# Patient Record
Sex: Male | Born: 1955 | Race: White | Hispanic: No | Smoking: Former smoker
Health system: Southern US, Community
[De-identification: ages and names within clinical notes are randomized; demographics above are authoritative.]

## PROBLEM LIST (undated history)

## (undated) DIAGNOSIS — I6521 Occlusion and stenosis of right carotid artery: Secondary | ICD-10-CM

## (undated) DIAGNOSIS — I509 Heart failure, unspecified: Secondary | ICD-10-CM

## (undated) DIAGNOSIS — I5020 Unspecified systolic (congestive) heart failure: Secondary | ICD-10-CM

## (undated) DIAGNOSIS — F1021 Alcohol dependence, in remission: Secondary | ICD-10-CM

## (undated) DIAGNOSIS — Z95810 Presence of automatic (implantable) cardiac defibrillator: Secondary | ICD-10-CM

## (undated) DIAGNOSIS — E7800 Pure hypercholesterolemia, unspecified: Secondary | ICD-10-CM

## (undated) DIAGNOSIS — R918 Other nonspecific abnormal finding of lung field: Secondary | ICD-10-CM

## (undated) DIAGNOSIS — I639 Cerebral infarction, unspecified: Secondary | ICD-10-CM

## (undated) DIAGNOSIS — I10 Essential (primary) hypertension: Secondary | ICD-10-CM

## (undated) HISTORY — PX: APPENDECTOMY: SHX54

## (undated) HISTORY — DX: Essential (primary) hypertension: I10

## (undated) HISTORY — PX: CATARACT EXTRACTION: SUR2

## (undated) HISTORY — DX: Pure hypercholesterolemia, unspecified: E78.00

## (undated) HISTORY — PX: TONSILLECTOMY: SUR1361

---
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
## 2019-05-01 DIAGNOSIS — R42 Dizziness and giddiness: Secondary | ICD-10-CM | POA: Diagnosis not present

## 2019-05-01 DIAGNOSIS — I472 Ventricular tachycardia: Secondary | ICD-10-CM | POA: Diagnosis not present

## 2019-05-01 DIAGNOSIS — I517 Cardiomegaly: Secondary | ICD-10-CM | POA: Diagnosis not present

## 2019-05-01 DIAGNOSIS — I110 Hypertensive heart disease with heart failure: Secondary | ICD-10-CM

## 2019-05-01 DIAGNOSIS — F1010 Alcohol abuse, uncomplicated: Secondary | ICD-10-CM

## 2019-05-01 DIAGNOSIS — I426 Alcoholic cardiomyopathy: Secondary | ICD-10-CM

## 2019-05-01 DIAGNOSIS — F17210 Nicotine dependence, cigarettes, uncomplicated: Secondary | ICD-10-CM

## 2019-05-01 DIAGNOSIS — I6389 Other cerebral infarction: Secondary | ICD-10-CM | POA: Diagnosis not present

## 2019-05-01 DIAGNOSIS — R739 Hyperglycemia, unspecified: Secondary | ICD-10-CM

## 2019-05-01 DIAGNOSIS — G4700 Insomnia, unspecified: Secondary | ICD-10-CM

## 2019-05-01 DIAGNOSIS — I5021 Acute systolic (congestive) heart failure: Secondary | ICD-10-CM | POA: Diagnosis not present

## 2019-05-02 DIAGNOSIS — I5021 Acute systolic (congestive) heart failure: Secondary | ICD-10-CM | POA: Diagnosis not present

## 2019-05-02 DIAGNOSIS — I6389 Other cerebral infarction: Secondary | ICD-10-CM | POA: Diagnosis not present

## 2019-05-02 DIAGNOSIS — R42 Dizziness and giddiness: Secondary | ICD-10-CM | POA: Diagnosis not present

## 2019-05-02 DIAGNOSIS — I472 Ventricular tachycardia: Secondary | ICD-10-CM | POA: Diagnosis not present

## 2019-05-03 DIAGNOSIS — I472 Ventricular tachycardia: Secondary | ICD-10-CM | POA: Diagnosis not present

## 2019-05-03 DIAGNOSIS — I517 Cardiomegaly: Secondary | ICD-10-CM | POA: Diagnosis not present

## 2019-05-03 DIAGNOSIS — R42 Dizziness and giddiness: Secondary | ICD-10-CM | POA: Diagnosis not present

## 2019-05-03 DIAGNOSIS — I5021 Acute systolic (congestive) heart failure: Secondary | ICD-10-CM | POA: Diagnosis not present

## 2019-05-03 DIAGNOSIS — I6389 Other cerebral infarction: Secondary | ICD-10-CM | POA: Diagnosis not present

## 2019-05-04 DIAGNOSIS — I5021 Acute systolic (congestive) heart failure: Secondary | ICD-10-CM | POA: Diagnosis not present

## 2019-05-04 DIAGNOSIS — R42 Dizziness and giddiness: Secondary | ICD-10-CM | POA: Diagnosis not present

## 2019-05-04 DIAGNOSIS — I6389 Other cerebral infarction: Secondary | ICD-10-CM | POA: Diagnosis not present

## 2019-05-04 DIAGNOSIS — I472 Ventricular tachycardia: Secondary | ICD-10-CM | POA: Diagnosis not present

## 2019-05-09 DIAGNOSIS — I5020 Unspecified systolic (congestive) heart failure: Secondary | ICD-10-CM

## 2019-05-09 DIAGNOSIS — I639 Cerebral infarction, unspecified: Secondary | ICD-10-CM

## 2019-05-09 DIAGNOSIS — Z8673 Personal history of transient ischemic attack (TIA), and cerebral infarction without residual deficits: Secondary | ICD-10-CM

## 2019-05-09 HISTORY — DX: Unspecified systolic (congestive) heart failure: I50.20

## 2019-05-09 HISTORY — DX: Personal history of transient ischemic attack (TIA), and cerebral infarction without residual deficits: Z86.73

## 2019-05-09 HISTORY — DX: Cerebral infarction, unspecified: I63.9

## 2019-05-15 DIAGNOSIS — I6521 Occlusion and stenosis of right carotid artery: Secondary | ICD-10-CM

## 2019-05-15 DIAGNOSIS — R918 Other nonspecific abnormal finding of lung field: Secondary | ICD-10-CM

## 2019-05-15 DIAGNOSIS — I6523 Occlusion and stenosis of bilateral carotid arteries: Secondary | ICD-10-CM | POA: Insufficient documentation

## 2019-05-15 HISTORY — DX: Occlusion and stenosis of right carotid artery: I65.21

## 2019-05-15 HISTORY — DX: Other nonspecific abnormal finding of lung field: R91.8

## 2019-05-15 HISTORY — DX: Occlusion and stenosis of bilateral carotid arteries: I65.23

## 2019-05-16 ENCOUNTER — Encounter: Payer: Self-pay | Admitting: *Deleted

## 2019-05-16 DIAGNOSIS — I509 Heart failure, unspecified: Secondary | ICD-10-CM | POA: Insufficient documentation

## 2019-05-16 DIAGNOSIS — E7800 Pure hypercholesterolemia, unspecified: Secondary | ICD-10-CM | POA: Insufficient documentation

## 2019-05-16 DIAGNOSIS — I10 Essential (primary) hypertension: Secondary | ICD-10-CM | POA: Insufficient documentation

## 2019-05-16 DIAGNOSIS — F1021 Alcohol dependence, in remission: Secondary | ICD-10-CM

## 2019-05-16 HISTORY — DX: Alcohol dependence, in remission: F10.21

## 2019-05-16 HISTORY — DX: Heart failure, unspecified: I50.9

## 2019-05-20 NOTE — Progress Notes (Signed)
Cardiology Office Note:    Date:  05/21/2019   ID:  Kenneth Pham, DOB 11-Jan-1956, MRN [id number]  PCP:  Olive Bass, MD  Cardiologist:  Norman Herrlich, MD   Referring MD: No ref. provider found  ASSESSMENT:    1. Dilated cardiomyopathy (HCC)   2. Hypertensive heart disease with heart failure (HCC)   3. Hypercholesteremia   4. Carotid stenosis, bilateral    PLAN:    In order of problems listed above:  1. He has a severe dilated cardiomyopathy he resume a diuretic he is in decompensated heart failure switch from ACE inhibitor to [place] start low-dose MRA and with the severity of his decompensation see him back and we can arrange for the home heart failure program with the [facility] to participate in his care.  Ultimately he may require coronary angiography left and right heart catheterization.  Depending on his response he may require an ICD.  I have asked him to start with Lasix like sodium restriction and daily weights. 2. Hypertension poorly controlled initiate a diuretic switch to Entresto start MRA heart failure decompensated start his diuretic 3. Continue statin 4. Mild bilateral stenosis will require follow-up duplex in a year  Next appointment 1 week   Medication Adjustments/Labs and Tests Ordered: Current medicines are reviewed at length with the patient today.  Concerns regarding medicines are outlined above.  No orders of the defined types were placed in this encounter.  No orders of the defined types were placed in this encounter.    Chief Complaint  Patient presents with  . Cardiomyopathy    EF < 20%    History of Present Illness:    Kenneth Pham is a 63 y.o. male with a history of hypertension and hyper lipidemia who is being seen today for the evaluation of heart failure at the request of No ref. provider found.  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  MRA and admission showed right vertebral artery stenosis and less than 50% right proximal ICA  stenosis. I reviewed the echocardiogram from [facility] performed during the admission EF estimated 20% associated with a low level static troponin elevation.  He was seen in consultation by a cardiologist 05/02/2019 there is low-level troponin elevation ranging 0.10-0.17 BNP level is elevated at 6910 EKG showed left ventricular hypertrophy and repolarization and echocardiogram is described as global hypokinesia ejection fraction 20%.  CT scan showed stroke in the right cerebellum.  His past history was noteworthy for hypertension and congestive heart failure he was on no home medications when he presented to the hospital MRI of the head and neck showed acute infarct in the right cerebellum moderate narrowing of the right vertebral artery mild narrowing of both proximal internal carotid arteries less than 50%.  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  Medical  therapy was initiated with diuretic beta-blocker ACE inhibitor..  He had no ventricular arrhythmia noted while in hospital.  On presentation to the hospital his initial blood pressure was 182/101.  In retrospect he has had significant shortness of breath for over 3 months he short of breath when he walks outdoors climbs stairs and works in [employer] and truly struggles to unload a truck.  He also has had orthopnea paroxysmal nocturnal dyspnea was unaware he had significant edema until I showed him on physical exam unfortunately the hospital he was told to take diuretic as needed me did not  feel he needed at times add salt to his food.  He has no history of angina coronary artery disease congenital rheumatic heart disease.  His alcohol intake is been constant but he drinks 225 ounce hard cider is a day has no history of alcohol withdrawal syndrome alcohol related illness and I doubt his cardiomyopathy is alcoholic.  Fortunately since his  stroke he does not smoke or drink.  He has had no syncope.  Presently he is quite short of breath and is having trouble having to sit up at night short of breath is physical examination shows decompensated heart failure  Past Medical History:  Diagnosis Date  . Acute CHF (HCC) 05/16/2019  . Alcohol dependence in remission (HCC) 05/16/2019  . Arteriosclerosis of carotid artery, right 05/15/2019  . Cerebellar stroke, acute (HCC) 05/09/2019  . Heart failure with reduced ejection fraction (HCC) 05/09/2019  . Hypercholesteremia   . Hypertension   . Pulmonary nodules 05/15/2019   05/01/2019 CT angio - 8.267mm bandlike nodular density lingula - 5.703mm R middle lobe - 61.5 pack year hx, rec 3 month f/u    Past Surgical History:  Procedure Laterality Date  . APPENDECTOMY    . CATARACT EXTRACTION Right   . TONSILLECTOMY      Current Medications: Current Meds  Medication Sig  . aspirin EC 81 MG tablet Take by mouth.  Marland Kitchen. atorvastatin (LIPITOR) 40 MG tablet Take by mouth daily.  . furosemide (LASIX) 20 MG tablet Take 20 mg by mouth daily as needed.   Marland Kitchen. lisinopril (ZESTRIL) 5 MG tablet Take 5 mg by mouth daily.   . metoprolol succinate (TOPROL-XL) 25 MG 24 hr tablet Take 25 mg by mouth daily.   Marland Kitchen. thiamine (VITAMIN B-1) 100 MG tablet Take 100 mg by mouth daily.      Allergies:   Patient has no known allergies.   Social History   Socioeconomic History  . Marital status: Legally Separated    Spouse name: Not on file  . Number of children: Not on file  . Years of education: Not on file  . Highest education level: Not on file  Occupational History  . Not on file  Social Needs  . Financial resource strain: Not on file  . Food insecurity    Worry: Not on file    Inability: Not on file  . Transportation needs    Medical: Not on file    Non-medical: Not on file  Tobacco Use  . Smoking status: Former Smoker    Types: Cigarettes    Quit date: 04/29/2019    Years since quitting: 0.0  . Smokeless  tobacco: Never Used  Substance and Sexual Activity  . Alcohol use: Not Currently    Alcohol/week: 14.0 standard drinks    Types: 14 Cans of beer per week  . Drug use: Not Currently    Types: Marijuana  . Sexual activity: Not on file  Lifestyle  . Physical activity    Days per week: Not on file    Minutes per session: Not on file  . Stress: Not on file  Relationships  . Social Musicianconnections    Talks on phone: Not on file    Gets together: Not on file    Attends religious service: Not on file    Active member of club or organization: Not on file    Attends meetings of clubs or organizations: Not on file    Relationship status: Not on file  Other Topics Concern  .  Not on file  Social History Narrative  . Not on file     Family History: The patient's family history includes Cancer in his father; Heart Problems in his brother; Stroke in his mother.  ROS:   Review of Systems  Constitution: Positive for malaise/fatigue.  HENT: Negative.   Eyes: Negative.   Cardiovascular: Positive for leg swelling, orthopnea and paroxysmal nocturnal dyspnea.  Respiratory: Positive for shortness of breath.   Endocrine: Negative.   Hematologic/Lymphatic: Negative.   Skin: Negative.   Musculoskeletal: Negative.   Gastrointestinal: Negative.   Genitourinary: Negative.   Neurological: Positive for dizziness.  Psychiatric/Behavioral: Negative.   Allergic/Immunologic: Negative.    Please see the history of present illness.     All other systems reviewed and are negative.  EKGs/Labs/Other Studies Reviewed:    The following studies were reviewed today:   EKG:  EKG 05/01/2019 is personally reviewed and demonstrates sinus rhythm LVH repolarization   Physical Exam:    VS:  Pulse 91   Ht 5\' 10"  (1.778 m)   Wt 179 lb 9.6 oz (81.5 kg)   SpO2 95%   BMI 25.77 kg/m     Wt Readings from Last 3 Encounters:  05/21/19 179 lb 9.6 oz (81.5 kg)  05/09/19 180 lb (81.6 kg)     GEN:  Well nourished,  well developed in no acute distress HEENT: Normal NECK: Moderate JVD; No carotid bruits LYMPHATICS: No lymphadenopathy CARDIAC: RRR S1 is diminished S3 gallop at the apex no murmur RESPIRATORY: Diffusely diminished breath sounds no rales  aBDOMEN: Soft, non-tender, non-distended MUSCULOSKELETAL: He has greater than 4+ bilateral ankle to knee pitting edema; No deformity  SKIN: Warm and dry NEUROLOGIC:  Alert and oriented x 3 PSYCHIATRIC:  Normal affect     Signed, Shirlee More, MD  05/21/2019 10:51 AM    [place]

## 2019-05-21 ENCOUNTER — Ambulatory Visit (INDEPENDENT_AMBULATORY_CARE_PROVIDER_SITE_OTHER): Payer: BC Managed Care – PPO | Admitting: Cardiology

## 2019-05-21 ENCOUNTER — Encounter: Payer: Self-pay | Admitting: Cardiology

## 2019-05-21 ENCOUNTER — Other Ambulatory Visit: Payer: Self-pay

## 2019-05-21 ENCOUNTER — Encounter: Payer: Self-pay | Admitting: *Deleted

## 2019-05-21 VITALS — BP 148/92 | HR 91 | Ht 70.0 in | Wt 179.6 lb

## 2019-05-21 DIAGNOSIS — I6523 Occlusion and stenosis of bilateral carotid arteries: Secondary | ICD-10-CM

## 2019-05-21 DIAGNOSIS — I110 Hypertensive heart disease with heart failure: Secondary | ICD-10-CM

## 2019-05-21 DIAGNOSIS — E7800 Pure hypercholesterolemia, unspecified: Secondary | ICD-10-CM

## 2019-05-21 DIAGNOSIS — I420 Dilated cardiomyopathy: Secondary | ICD-10-CM

## 2019-05-21 LAB — BASIC METABOLIC PANEL
BUN/Creatinine Ratio: 12 (ref 10–24)
BUN: 15 mg/dL (ref 8–27)
CO2: 21 mmol/L (ref 20–29)
Calcium: 9.6 mg/dL (ref 8.6–10.2)
Chloride: 104 mmol/L (ref 96–106)
Creatinine, Ser: 1.25 mg/dL (ref 0.76–1.27)
GFR calc Af Amer: 71 mL/min/{1.73_m2} (ref >59)
GFR calc non Af Amer: 61 mL/min/{1.73_m2} (ref >59)
Glucose: 102 mg/dL — ABNORMAL HIGH (ref 65–99)
Potassium: 5.7 mmol/L — ABNORMAL HIGH (ref 3.5–5.2)
Sodium: 138 mmol/L (ref 134–144)

## 2019-05-21 LAB — PRO B NATRIURETIC PEPTIDE: NT-Pro BNP: 9196 pg/mL — ABNORMAL HIGH (ref 0–210)

## 2019-05-21 MED ORDER — SPIRONOLACTONE 25 MG PO TABS: 12.5000 mg | ORAL_TABLET | Freq: Every day | ORAL | 1 refills | Status: DC

## 2019-05-21 MED ORDER — SACUBITRIL-VALSARTAN 49-51 MG PO TABS: 1.0000 | ORAL_TABLET | Freq: Two times a day (BID) | ORAL | 3 refills | Status: DC

## 2019-05-21 MED ORDER — FUROSEMIDE 20 MG PO TABS: 20.0000 mg | ORAL_TABLET | Freq: Two times a day (BID) | ORAL | 1 refills | Status: DC

## 2019-05-21 NOTE — Patient Instructions (Signed)
Medication Instructions:  Your physician has recommended you make the following change in your medication:   STOP lisinopril  START sacubitril-valsartan (entresto) 49-51 mg: Take 1 tablet twice daily.Marland KitchenMarland KitchenAFTER STOPPING LISINOPRIL FOR 3 DAYS!   START spironolactone (aldactone) 25 mg: Take 0.5 tablet (12.5 mg) daily  INCREASE furosemide (lasix) 20 mg: Take 1 tablet twice daily  If you need a refill on your cardiac medications before your next appointment, please call your pharmacy.   Lab work: Your physician recommends that you return for lab work today: BMP, ProBNP.   If you have labs (blood work) drawn today and your tests are completely normal, you will receive your results only by: Marland Kitchen MyChart Message (if you have MyChart) OR . A paper copy in the mail If you have any lab test that is abnormal or we need to change your treatment, we will call you to review the results.  Testing/Procedures: None  Follow-Up: At [facility], you and your health needs are our priority.  As part of our continuing mission to provide you with exceptional heart care, we have created designated Provider Care Teams.  These Care Teams include your primary Cardiologist (physician) and Advanced Practice Providers (APPs -  Physician Assistants and Nurse Practitioners) who all work together to provide you with the care you need, when you need it. You will need a follow up appointment in 1 weeks.     Sacubitril; Valsartan oral tablet What is this medicine? SACUBITRIL; VALSARTAN (sak UE bi tril; val SAR tan) is a combination of 2 drugs used to reduce the risk of death and hospitalizations in people with long-lasting heart failure. It is usually used with other medicines to treat heart failure. This medicine may be used for other purposes; ask your health care provider or pharmacist if you have questions. COMMON BRAND NAME(S): Entresto What should I tell my health care provider before I take this medicine? They  need to know if you have any of these conditions:  diabetes and take a medicine that contains aliskiren  kidney disease  liver disease  an unusual or allergic reaction to sacubitril; valsartan, drugs called angiotensin converting enzyme (ACE) inhibitors, angiotensin II receptor blockers (ARBs), other medicines, foods, dyes, or preservatives  pregnant or trying to get pregnant  breast-feeding How should I use this medicine? Take this medicine by mouth with a glass of water. Follow the directions on the prescription label. You can take it with or without food. If it upsets your stomach, take it with food. Take your medicine at regular intervals. Do not take it more often than directed. Do not stop taking except on your doctor's advice. Do not take this medicine for at least 36 hours before or after you take an ACE inhibitor medicine. Talk to your health care provider if you are not sure if you take an ACE inhibitor. Talk to your pediatrician regarding the use of this medicine in children. Special care may be needed. Overdosage: If you think you have taken too much of this medicine contact a poison control center or emergency room at once. NOTE: This medicine is only for you. Do not share this medicine with others. What if I miss a dose? If you miss a dose, take it as soon as you can. If it is almost time for next dose, take only that dose. Do not take double or extra doses. What may interact with this medicine? Do not take this medicine with any of the following medicines:  aliskiren if  you have diabetes  angiotensin-converting enzyme (ACE) inhibitors, like benazepril, captopril, enalapril, fosinopril, lisinopril, or ramipril This medicine may also interact with the following medicines:  angiotensin II receptor blockers (ARBs) like azilsartan, candesartan, eprosartan, irbesartan, losartan, olmesartan, telmisartan, or valsartan  lithium  NSAIDS, medicines for pain and inflammation, like  ibuprofen or naproxen  potassium-sparing diuretics like amiloride, spironolactone, and triamterene  potassium supplements This list may not describe all possible interactions. Give your health care provider a list of all the medicines, herbs, non-prescription drugs, or dietary supplements you use. Also tell them if you smoke, drink alcohol, or use illegal drugs. Some items may interact with your medicine. What should I watch for while using this medicine? Tell your doctor or healthcare professional if your symptoms do not start to get better or if they get worse. Do not become pregnant while taking this medicine. Women should inform their doctor if they wish to become pregnant or think they might be pregnant. There is a potential for serious side effects to an unborn child. Talk to your health care professional or pharmacist for more information. You may get dizzy. Do not drive, use machinery, or do anything that needs mental alertness until you know how this medicine affects you. Do not stand or sit up quickly, especially if you are an older patient. This reduces the risk of dizzy or fainting spells. Avoid alcoholic drinks; they can make you more dizzy. What side effects may I notice from receiving this medicine? Side effects that you should report to your doctor or health care professional as soon as possible:  allergic reactions like skin rash, itching or hives, swelling of the face, lips, or tongue  signs and symptoms of increased potassium like muscle weakness; chest pain; or fast, irregular heartbeat  signs and symptoms of kidney injury like trouble passing urine or change in the amount of urine  signs and symptoms of low blood pressure like feeling dizzy or lightheaded, or if you develop extreme fatigue Side effects that usually do not require medical attention (report to your doctor or health care professional if they continue or are bothersome):  cough This list may not describe all  possible side effects. Call your doctor for medical advice about side effects. You may report side effects to FDA at 1-800-FDA-1088. Where should I keep my medicine? Keep out of the reach of children. Store at room temperature between 15 and 30 degrees C (59 and 86 degrees F). Throw away any unused medicine after the expiration date. NOTE: This sheet is a summary. It may not cover all possible information. If you have questions about this medicine, talk to your doctor, pharmacist, or health care provider.  2020 Elsevier/Gold Standard (2015-12-17 13:54:19)   Spironolactone tablets What is this medicine? SPIRONOLACTONE ([place] on [place] LAK tone) is a diuretic. It helps you make more urine and to lose excess water from your body. This medicine is used to treat high blood pressure, and edema or swelling from heart, kidney, or liver disease. It is also used to treat patients who make too much aldosterone or have low potassium. This medicine may be used for other purposes; ask your health care provider or pharmacist if you have questions. COMMON BRAND NAME(S): Aldactone What should I tell my health care provider before I take this medicine? They need to know if you have any of these conditions:  high blood level of potassium  kidney disease or trouble making urine  liver disease  an unusual or allergic  reaction to spironolactone, other medicines, foods, dyes, or preservatives  pregnant or trying to get pregnant  breast-feeding How should I use this medicine? Take this medicine by mouth with a drink of water. Follow the directions on your prescription label. You can take it with or without food. If it upsets your stomach, take it with food. Do not take your medicine more often than directed. Remember that you will need to pass more urine after taking this medicine. Do not take your doses at a time of day that will cause you problems. Do not take at bedtime. Talk to your pediatrician regarding the  use of this medicine in children. While this drug may be prescribed for selected conditions, precautions do apply. Overdosage: If you think you have taken too much of this medicine contact a poison control center or emergency room at once. NOTE: This medicine is only for you. Do not share this medicine with others. What if I miss a dose? If you miss a dose, take it as soon as you can. If it is almost time for your next dose, take only that dose. Do not take double or extra doses. What may interact with this medicine? Do not take this medicine with any of the following medications:  cidofovir  eplerenone  tranylcypromine This medicine may also interact with the following medications:  aspirin  certain medicines for blood pressure or heart disease like benazepril, lisinopril, losartan, valsartan  certain medicines that treat or prevent blood clots like heparin and enoxaparin  cholestyramine  cyclosporine  digoxin  lithium  medicines that relax muscles for surgery  NSAIDs, medicines for pain and inflammation, like ibuprofen or naproxen  other diuretics  potassium supplements  steroid medicines like prednisone or cortisone  trimethoprim This list may not describe all possible interactions. Give your health care provider a list of all the medicines, herbs, non-prescription drugs, or dietary supplements you use. Also tell them if you smoke, drink alcohol, or use illegal drugs. Some items may interact with your medicine. What should I watch for while using this medicine? Visit your doctor or health care professional for regular checks on your progress. Check your blood pressure as directed. Ask your doctor what your blood pressure should be, and when you should contact them. You may need to be on a special diet while taking this medicine. Ask your doctor. Also, ask how many glasses of fluid you need to drink a day. You must not get dehydrated. This medicine may make you feel  confused, dizzy or lightheaded. Drinking alcohol and taking some medicines can make this worse. Do not drive, use machinery, or do anything that needs mental alertness until you know how this medicine affects you. Do not sit or stand up quickly. What side effects may I notice from receiving this medicine? Side effects that you should report to your doctor or health care professional as soon as possible:  allergic reactions such as skin rash or itching, hives, swelling of the lips, mouth, tongue, or throat  black or tarry stools  fast, irregular heartbeat  fever  muscle pain, cramps  numbness, tingling in hands or feet  trouble breathing  trouble passing urine  unusual bleeding  unusually weak or tired Side effects that usually do not require medical attention (report to your doctor or health care professional if they continue or are bothersome):  change in voice or hair growth  confusion  dizzy, drowsy  dry mouth, increased thirst  enlarged or tender breasts  headache  irregular menstrual periods  sexual difficulty, unable to have an erection  stomach upset This list may not describe all possible side effects. Call your doctor for medical advice about side effects. You may report side effects to FDA at 1-800-FDA-1088. Where should I keep my medicine? Keep out of the reach of children. Store below 25 degrees C (77 degrees F). Throw away any unused medicine after the expiration date. NOTE: This sheet is a summary. It may not cover all possible information. If you have questions about this medicine, talk to your doctor, pharmacist, or health care provider.  2020 Elsevier/Gold Standard (2017-03-18 09:42:28)

## 2019-05-27 NOTE — Progress Notes (Signed)
Cardiology Office Note:    Date:  05/29/2019   ID:  [name], DOB [birth year], MRN [id number]  PCP:  Olive Bass, MD  Cardiologist:  Norman Herrlich, MD    Referring MD: Olive Bass, MD    ASSESSMENT:    1. Hypertensive heart disease with heart failure (HCC)   2. Chronic systolic heart failure (HCC)   3. Dilated cardiomyopathy (HCC)   4. Carotid stenosis, bilateral   5. CKD (chronic kidney disease) stage 2, GFR 60-89 ml/min   6. Snores    PLAN:    In order of problems listed above:  1. Hypertensive heart disease with heart failure -   HTN: Concern for orthostatic hypotension. BP on standing 104/80. Will change Entresto 49/51mg  to daily for 2 weeks then increase to twice daily. Continue to check BP daily at home. Encouraged him to be slow to stand, use of compression stockings. NYHA class II.   HF: Much improved from last visit. Continue dyspnea on exertion, PND. Short of breath even speaking to me during exam. Obtain ProBNP today. Otherwise appears euvolemic - no edema. Has been on Entresto 4 days, hope for improvement with continued dosing. Concern that this SOB is of other origin - consider anemia versus lung disease. Obtain CBC today.   GDMT: Loop diuretic, low dose MRA, Entresto. BMP today.  2. Chronic systolic heart failure - EF 20% at [facility] 04/2019. Plan as above, see #1. Consider repeat echocardiogram 3 months after initiation of Entresto for consideration of ICD.  3. Dilated cardiomyopathy - Plan as above, see #1 and #2. 4. CKD2 - BMP today. Careful titration of diuretics and Entresto.  5. HLD - Stable. Continue atorvastatin. Consider recheck of lipid profile.  6. Carotid stenosis bilateral - Mild bilateral stenosis. Follow up duplex in one year, approximately June 2021.  7. CVA - Follows with neurology. Modification of risk factors including HTN, carotid stenosis, HLD.  8. Snores - Consider outpatient sleep study evaluation.    Next appointment: 4  weeks   Medication Adjustments/Labs and Tests Ordered: Current medicines are reviewed at length with the patient today.  Concerns regarding medicines are outlined above.  Orders Placed This Encounter  Procedures  . CBC  . Pro b natriuretic peptide  . Basic Metabolic Panel (BMET)   Meds ordered this encounter  Medications  . sacubitril-valsartan (ENTRESTO) 49-51 MG    Sig: Take 1 tab daily x 2 weeks then increase to 1 tab twice daily    Dispense:  60 tablet    Refill:  1    Please Honor Card patient is presenting for Cecelia Byars: [id number]; Alvira Philips: [id number]; RXCPN: [id number]; ISSUER: [id number] ID: [id number]  . furosemide (LASIX) 20 MG tablet    Sig: Take 1 tablet (20 mg total) by mouth 2 (two) times daily.    Dispense:  60 tablet    Refill:  1  . spironolactone (ALDACTONE) 25 MG tablet    Sig: Take 0.5 tablets (12.5 mg total) by mouth daily.    Dispense:  15 tablet    Refill:  2    Chief Complaint: 63 yo male presents for follow up of heart failure and cardiomyopathy after initiation of Entresto and adjustment of diuretics.   History of Present Illness:    Sylvesta Kottman is a 63 y.o. male with a hx of hypertension and hyperlipidemia.  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  MRA and admission showed right vertebral artery stenosis and less than 50% right proximal ICA  stenosis.  I reviewed the echocardi[facility] performed during the admission EF estimated 20% associated with a low level static troponin elevation.  He was seen in consultation by a cardiologist 05/02/2019 there is low-level troponin elevation ranging 0.10-0.17 BNP level is elevated at 6910 EKG showed left ventricular hypertrophy and repolarization and echocardiogram is described as global hypokinesia ejection fraction 20%.  CT scan showed stroke in the right cerebellum.  His past history was noteworthy for hypertension and congestive heart failure he was on no home medications when he presented to the  hospital MRI of the head and neck showed acute infarct in the right cerebellum moderate narrowing of the right vertebral artery mild narrowing of both proximal internal carotid arteries less than 50%.  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  Medical  therapy was initiated with diuretic beta-blocker ACE inhibitor..  He had no ventricular arrhythmia noted while in hospital.  On presentation to the hospital his initial blood pressure was 182/101.  He was last seen by me in the office 05/21/2019 with decompensated heart failure.  With his severe cardiomyopathy I asked him to resume a loop diuretic and transition from ACE inhibitor to [place].  We initiated discussion about the potential for an ICD and made arrangements for office follow-up in 1 week.  He also has mild bilateral carotid stenosis will require a follow-up cerebrovascular duplex in 1 year.  He reports feeling some better than when last seen. Has been on Entresto for 4 days and notices some improvement in his breathing and energy. Does still report that he is short of breath on exertion and when laying down. He is able to sleep in the bed with one pillow, but has episodes of PND every night.   Reports no lower extremity edema. Tells me he weighs himself daily and consistently gets 170lbs. Checks his weight daily, encouraged to continue.   Does endorse new onset dizziness over the last 2-3 weeks. Reports feeling "lightheaded" when he stands up. Standing BP checked - 104/80. Associated with feelings of loss of balance. No syncope. We discussed taking it slow when standing and compression socks.  He works at [employer] and has been out of work for one month. Brought paperwork for FMLA and will have him go through the process to submit this for approval. He is considering retiring.   He has an appointment with neurology tomorrow for stroke  follow up. Encouraged him to keep the appointment.   Compliance with diet, lifestyle and medications: Yes Past Medical History:  Diagnosis Date  . Acute CHF (HCC) 05/16/2019  . Alcohol dependence in remission (HCC) 05/16/2019  . Arteriosclerosis of carotid artery, right 05/15/2019  . Cerebellar stroke, acute (HCC) 05/09/2019  . Heart failure with reduced ejection fraction (HCC) 05/09/2019  . Hypercholesteremia   . Hypertension   . Pulmonary nodules 05/15/2019   05/01/2019 CT angio - 8.167mm bandlike nodular density lingula - 5.363mm R middle lobe - 61.5 pack year hx, rec 3 month f/u    Past Surgical History:  Procedure Laterality Date  . APPENDECTOMY    . CATARACT EXTRACTION Right   . TONSILLECTOMY      Current Medications: Current Meds  Medication Sig  . aspirin EC 81 MG tablet Take 81 mg by mouth daily.   Marland Kitchen. atorvastatin (LIPITOR) 40 MG tablet Take 40 mg by mouth daily.   . furosemide (LASIX) 20 MG tablet  Take 1 tablet (20 mg total) by mouth 2 (two) times daily.  . metoprolol succinate (TOPROL-XL) 25 MG 24 hr tablet Take 25 mg by mouth daily.   . sacubitril-valsartan (ENTRESTO) 49-51 MG Take 1 tab daily x 2 weeks then increase to 1 tab twice daily  . spironolactone (ALDACTONE) 25 MG tablet Take 0.5 tablets (12.5 mg total) by mouth daily.  Marland Kitchen. thiamine (VITAMIN B-1) 100 MG tablet Take 100 mg by mouth daily.   . [DISCONTINUED] furosemide (LASIX) 20 MG tablet Take 1 tablet (20 mg total) by mouth 2 (two) times daily.  . [DISCONTINUED] sacubitril-valsartan (ENTRESTO) 49-51 MG Take 1 tablet by mouth 2 (two) times daily.  . [DISCONTINUED] spironolactone (ALDACTONE) 25 MG tablet Take 0.5 tablets (12.5 mg total) by mouth daily.     Allergies:   Patient has no known allergies.   Social History   Socioeconomic History  . Marital status: Legally Separated    Spouse name: Not on file  . Number of children: Not on file  . Years of education: Not on file  . Highest education level: Not on file   Occupational History  . Not on file  Social Needs  . Financial resource strain: Not on file  . Food insecurity    Worry: Not on file    Inability: Not on file  . Transportation needs    Medical: Not on file    Non-medical: Not on file  Tobacco Use  . Smoking status: Former Smoker    Types: Cigarettes    Quit date: 04/29/2019    Years since quitting: 0.0  . Smokeless tobacco: Never Used  Substance and Sexual Activity  . Alcohol use: Not Currently    Alcohol/week: 14.0 standard drinks    Types: 14 Cans of beer per week  . Drug use: Not Currently    Types: Marijuana  . Sexual activity: Not on file  Lifestyle  . Physical activity    Days per week: Not on file    Minutes per session: Not on file  . Stress: Not on file  Relationships  . Social Musicianconnections    Talks on phone: Not on file    Gets together: Not on file    Attends religious service: Not on file    Active member of club or organization: Not on file    Attends meetings of clubs or organizations: Not on file    Relationship status: Not on file  Other Topics Concern  . Not on file  Social History Narrative  . Not on file     Family History: The patient's family history includes Cancer in his father; Heart Problems in his brother; Stroke in his mother. ROS:   Please see the history of present illness.    Review of Systems  Constitution: Negative for chills, fever and malaise/fatigue.  Eyes: Positive for visual disturbance ("black spots from cataracts").  Cardiovascular: Positive for dyspnea on exertion and paroxysmal nocturnal dyspnea. Negative for chest pain, irregular heartbeat, leg swelling, orthopnea, palpitations and syncope.  Respiratory: Positive for shortness of breath, sleep disturbances due to breathing (wakes up at night short of breath), snoring and wheezing. Negative for cough.   Gastrointestinal: Negative for nausea and vomiting.  Neurological: Positive for dizziness and light-headedness. Negative  for weakness.    All other systems reviewed and are negative.  EKGs/Labs/Other Studies Reviewed:    The following studies were reviewed today:   Recent Labs: 05/21/2019: BUN 15; Creatinine, Ser 1.25; NT-Pro BNP 9,196; Potassium  5.7; Sodium 138  Recent Lipid Panel No results found for: CHOL, TRIG, HDL, CHOLHDL, VLDL, LDLCALC, LDLDIRECT  Physical Exam:    VS:  BP 114/80 (BP Location: Right Arm, Patient Position: Sitting, Cuff Size: Normal)   Pulse 88   Temp 99.4 F (37.4 C)   Ht 5\' 10"  (1.778 m)   Wt 174 lb (78.9 kg)   SpO2 97%   BMI 24.97 kg/m     Wt Readings from Last 3 Encounters:  05/29/19 174 lb (78.9 kg)  05/21/19 179 lb 9.6 oz (81.5 kg)  05/09/19 180 lb (81.6 kg)    GEN:  Well nourished, thin, well developed in no acute distress HEENT: Normal NECK: No JVD; No carotid bruits LYMPHATICS: No lymphadenopathy CARDIAC: RRR, no murmurs, rubs, gallops VVS: R and L radial pulse 2+. R and L posterior tibial pulse 2+.  RESPIRATORY:  Clear to auscultation without rales, wheezing or rhonchi, diminished in the bases bilaterally ABDOMEN: Soft, non-tender, non-distended MUSCULOSKELETAL:  No edema; No deformity  SKIN: Warm and dry NEUROLOGIC:  Alert and oriented x 3 PSYCHIATRIC:  Normal affect    Signed, Shirlee More, MD  05/29/2019 2:23 PM    [place]

## 2019-05-29 ENCOUNTER — Other Ambulatory Visit: Payer: Self-pay

## 2019-05-29 ENCOUNTER — Encounter: Payer: Self-pay | Admitting: Cardiology

## 2019-05-29 ENCOUNTER — Ambulatory Visit (INDEPENDENT_AMBULATORY_CARE_PROVIDER_SITE_OTHER): Payer: BC Managed Care – PPO | Admitting: Cardiology

## 2019-05-29 VITALS — BP 114/80 | HR 88 | Temp 99.4°F | Ht 70.0 in | Wt 174.0 lb

## 2019-05-29 DIAGNOSIS — I6523 Occlusion and stenosis of bilateral carotid arteries: Secondary | ICD-10-CM

## 2019-05-29 DIAGNOSIS — N182 Chronic kidney disease, stage 2 (mild): Secondary | ICD-10-CM

## 2019-05-29 DIAGNOSIS — I5022 Chronic systolic (congestive) heart failure: Secondary | ICD-10-CM

## 2019-05-29 DIAGNOSIS — I420 Dilated cardiomyopathy: Secondary | ICD-10-CM

## 2019-05-29 DIAGNOSIS — N1831 Chronic kidney disease, stage 3a: Secondary | ICD-10-CM

## 2019-05-29 DIAGNOSIS — I110 Hypertensive heart disease with heart failure: Secondary | ICD-10-CM

## 2019-05-29 DIAGNOSIS — R0683 Snoring: Secondary | ICD-10-CM | POA: Insufficient documentation

## 2019-05-29 HISTORY — DX: Dilated cardiomyopathy: I42.0

## 2019-05-29 HISTORY — DX: Chronic kidney disease, stage 3a: N18.31

## 2019-05-29 HISTORY — DX: Snoring: R06.83

## 2019-05-29 HISTORY — DX: Chronic kidney disease, stage 2 (mild): N18.2

## 2019-05-29 HISTORY — DX: Hypertensive heart disease with heart failure: I11.0

## 2019-05-29 MED ORDER — SACUBITRIL-VALSARTAN 49-51 MG PO TABS: ORAL_TABLET | ORAL | 1 refills | Status: DC

## 2019-05-29 MED ORDER — FUROSEMIDE 20 MG PO TABS: 20.0000 mg | ORAL_TABLET | Freq: Two times a day (BID) | ORAL | 1 refills | Status: DC

## 2019-05-29 MED ORDER — SPIRONOLACTONE 25 MG PO TABS: 12.5000 mg | ORAL_TABLET | Freq: Every day | ORAL | 2 refills | Status: DC

## 2019-05-29 NOTE — Patient Instructions (Addendum)
Medication Instructions:   Your physician has recommended you make the following change in your medication:   CHANGE Entresto 49/51 : Take 1 tab daily x 2 weeks then increase to 1 tab twice daily  If you need a refill on your cardiac medications before your next appointment, please call your pharmacy.   Lab work: Your physician recommends that you return for lab work in: TODAY BMP,Pro BNP,CBC  If you have labs (blood work) drawn today and your tests are completely normal, you will receive your results only by: Marland Kitchen MyChart Message (if you have MyChart) OR . A paper copy in the mail If you have any lab test that is abnormal or we need to change your treatment, we will call you to review the results.  Testing/Procedures: none  Follow-Up: At [facility], you and your health needs are our priority.  As part of our continuing mission to provide you with exceptional heart care, we have created designated Provider Care Teams.  These Care Teams include your primary Cardiologist (physician) and Advanced Practice Providers (APPs -  Physician Assistants and Nurse Practitioners) who all work together to provide you with the care you need, when you need it. You will need a follow up appointment in 4 weeks.  Any Other Special Instructions Will Be Listed Below (If Applicable).

## 2019-05-30 ENCOUNTER — Other Ambulatory Visit: Payer: Self-pay | Admitting: *Deleted

## 2019-05-30 ENCOUNTER — Encounter: Payer: Self-pay | Admitting: Neurology

## 2019-05-30 ENCOUNTER — Ambulatory Visit: Payer: BC Managed Care – PPO | Admitting: Neurology

## 2019-05-30 VITALS — BP 150/72 | HR 68 | Temp 98.6°F | Ht 70.0 in | Wt 174.0 lb

## 2019-05-30 DIAGNOSIS — I639 Cerebral infarction, unspecified: Secondary | ICD-10-CM

## 2019-05-30 DIAGNOSIS — I420 Dilated cardiomyopathy: Secondary | ICD-10-CM

## 2019-05-30 DIAGNOSIS — I5022 Chronic systolic (congestive) heart failure: Secondary | ICD-10-CM

## 2019-05-30 DIAGNOSIS — I110 Hypertensive heart disease with heart failure: Secondary | ICD-10-CM

## 2019-05-30 LAB — CBC
Hematocrit: 50.3 % (ref 37.5–51.0)
Hemoglobin: 17.1 g/dL (ref 13.0–17.7)
MCH: 32 pg (ref 26.6–33.0)
MCHC: 34 g/dL (ref 31.5–35.7)
MCV: 94 fL (ref 79–97)
Platelets: 354 10*3/uL (ref 150–450)
RBC: 5.35 x10E6/uL (ref 4.14–5.80)
RDW: 12.8 % (ref 11.6–15.4)
WBC: 11 10*3/uL — ABNORMAL HIGH (ref 3.4–10.8)

## 2019-05-30 LAB — BASIC METABOLIC PANEL
BUN/Creatinine Ratio: 17 (ref 10–24)
BUN: 21 mg/dL (ref 8–27)
CO2: 23 mmol/L (ref 20–29)
Calcium: 10.4 mg/dL — ABNORMAL HIGH (ref 8.6–10.2)
Chloride: 101 mmol/L (ref 96–106)
Creatinine, Ser: 1.22 mg/dL (ref 0.76–1.27)
GFR calc Af Amer: 73 mL/min/{1.73_m2} (ref >59)
GFR calc non Af Amer: 63 mL/min/{1.73_m2} (ref >59)
Glucose: 102 mg/dL — ABNORMAL HIGH (ref 65–99)
Potassium: 6 mmol/L — ABNORMAL HIGH (ref 3.5–5.2)
Sodium: 141 mmol/L (ref 134–144)

## 2019-05-30 LAB — PRO B NATRIURETIC PEPTIDE: NT-Pro BNP: 1685 pg/mL — ABNORMAL HIGH (ref 0–210)

## 2019-05-30 MED ORDER — FUROSEMIDE 20 MG PO TABS: 20.0000 mg | ORAL_TABLET | Freq: Two times a day (BID) | ORAL | 1 refills | Status: DC

## 2019-05-30 MED ORDER — SPIRONOLACTONE 25 MG PO TABS: 12.5000 mg | ORAL_TABLET | Freq: Every day | ORAL | 1 refills | Status: DC

## 2019-05-30 MED ORDER — SACUBITRIL-VALSARTAN 49-51 MG PO TABS: ORAL_TABLET | ORAL | 1 refills | Status: DC

## 2019-05-30 NOTE — Progress Notes (Signed)
[facility] [address] [place]. [place] [address] [phone]       OFFICE CONSULT NOTE  Mr. Dallas Torok Date of Birth:  03/03/1956 Medical Record Number:  [id number]   Referring MD: Dr. Louanne Belton Reason for Referral: Stroke HPI: Mr. Capozzi is a 63 year old Caucasian male seen today for initial office consultation visit for stroke.  History is obtained from him, review of referral notes and electronic medical records.  I personally reviewed imaging films in PACS.  He presented to [facility] with sudden onset of dizziness vertigo and gait imbalance which worsened over 2 days.  MRI scan of the brain obtained at [facility] which I personally reviewed shows right posterior inferior cerebellar artery infarct which is patchy.  Intracranial MRA was not done.  Extracranial carotid ultrasound showed no significant stenosis.  LDL cholesterol is elevated at 154 mg percent.  2D echo showed ejection fraction of 20% with global hypokinesis.  Patient was started on aspirin.  He was referred to outpatient physical occupational therapy but he declined as he felt if improved quickly.  He states his balance is a lot better though he has to be careful and cannot walk quickly.  He has had no falls or injuries.  Is tolerating aspirin well without side effects.  He is also on Lipitor 40 mg which is tolerating well without muscle aches and pains.  He has been started on Aldactone and Toprol-XL and Lasix by his cardiologist but blood pressure remains elevated and today it is 150/72.  He has quit smoking completely after having smoked for 30+ years.  He has no prior history of strokes TIAs or significant neurological problems.  There is no family history of stroke.  ROS:   14 system review of systems is positive for dizziness and gait ataxia only and all other systems negative  PMH:  Past Medical History:  Diagnosis Date  . Acute CHF ([place]) 05/16/2019  . Alcohol dependence in remission  ([place]) 05/16/2019  . Arteriosclerosis of carotid artery, right 05/15/2019  . Cerebellar stroke, acute ([place]) 05/09/2019  . Heart failure with reduced ejection fraction ([place]) 05/09/2019  . Hypercholesteremia   . Hypertension   . Pulmonary nodules 05/15/2019   05/01/2019 CT angio - 8.52mm bandlike nodular density lingula - 5.84mm R middle lobe - 61.5 pack year hx, rec 3 month f/u    Social History:  Social History   Socioeconomic History  . Marital status: Legally Separated    Spouse name: Not on file  . Number of children: Not on file  . Years of education: Not on file  . Highest education level: Not on file  Occupational History  . Not on file  Social Needs  . Financial resource strain: Not on file  . Food insecurity    Worry: Not on file    Inability: Not on file  . Transportation needs    Medical: Not on file    Non-medical: Not on file  Tobacco Use  . Smoking status: Former Smoker    Types: Cigarettes    Quit date: 04/29/2019    Years since quitting: 0.0  . Smokeless tobacco: Never Used  Substance and Sexual Activity  . Alcohol use: Not Currently    Alcohol/week: 14.0 standard drinks    Types: 14 Cans of beer per week  . Drug use: Not Currently    Types: Marijuana  . Sexual activity: Not on file  Lifestyle  . Physical activity    Days per week: Not  on file    Minutes per session: Not on file  . Stress: Not on file  Relationships  . Social Musician on phone: Not on file    Gets together: Not on file    Attends religious service: Not on file    Active member of club or organization: Not on file    Attends meetings of clubs or organizations: Not on file    Relationship status: Not on file  . Intimate partner violence    Fear of current or ex partner: Not on file    Emotionally abused: Not on file    Physically abused: Not on file    Forced sexual activity: Not on file  Other Topics Concern  . Not on file  Social History Narrative  . Not on file     Medications:   Current Outpatient Medications on File Prior to Visit  Medication Sig Dispense Refill  . aspirin EC 81 MG tablet Take 81 mg by mouth daily.     Marland Kitchen atorvastatin (LIPITOR) 40 MG tablet Take 40 mg by mouth daily.     . metoprolol succinate (TOPROL-XL) 25 MG 24 hr tablet Take 25 mg by mouth daily.     Marland Kitchen thiamine (VITAMIN B-1) 100 MG tablet Take 100 mg by mouth daily.      No current facility-administered medications on file prior to visit.     Allergies:  No Known Allergies  Physical Exam General: well developed, well nourished middle-aged Caucasian male, seated, in no evident distress Head: head normocephalic and atraumatic.   Neck: supple with no carotid or supraclavicular bruits Cardiovascular: regular rate and rhythm, no murmurs Musculoskeletal: no deformity Skin:  no rash/petichiae Vascular:  Normal pulses all extremities  Neurologic Exam Mental Status: Awake and fully alert. Oriented to place and time. Recent and remote memory intact. Attention span, concentration and fund of knowledge appropriate. Mood and affect appropriate.  Cranial Nerves: Fundoscopic exam reveals sharp disc margins. Pupils equal, briskly reactive to light. Extraocular movements full without nystagmus. Visual fields full to confrontation. Hearing intact. Facial sensation intact. Face, tongue, palate moves normally and symmetrically.  Motor: Normal bulk and tone. Normal strength in all tested extremity muscles. Sensory.: intact to touch , pinprick , position and vibratory sensation.  Coordination: Rapid alternating movements normal in all extremities. Finger-to-nose and heel-to-shin performed accurately bilaterally. Gait and Station: Arises from chair without difficulty. Stance is normal. Gait demonstrates normal stride length and balance . Able to heel, toe and tandem walk with great difficulty.  Reflexes: 1+ and symmetric. Toes downgoing.   NIHSS  [place]  1   ASSESSMENT: 63 year old  Caucasian male with right posterior inferior cerebral artery infarct in June 2020 likely cardioembolic from cardiomyopathy with ejection fraction of 20%.  Vascular risk factors of congestive heart failure, hyper lipidemia, hypertension and smoking.     PLAN: I had a long d/w patient about his recent cerebellar stroke, risk for recurrent stroke/TIAs, personally independently reviewed imaging studies and stroke evaluation results and answered questions.Continue aspirin 81 mg daily  for secondary stroke prevention and maintain strict control of hypertension with blood pressure goal below 130/90, diabetes with hemoglobin A1c goal below 6.5% and lipids with LDL cholesterol goal below 70 mg/dL. I also advised the patient to eat a healthy diet with plenty of whole grains, cereals, fruits and vegetables, exercise regularly and maintain ideal body weight .  Recommend we check MRI of the brain and neck to rule out intracranial  and extracranial stenosis as well as hemoglobin A1c and follow-up lipid profile.  Also 30-day cardiac event monitoring for paroxysmal arrhythmias.  Greater than 50% time during this 45-minute visit was spent on counseling and coordination of care about his stroke and discussion about stroke prevention and treatment and answering questions followup in the future with me in 2 months or call earlier if necessary. Delia HeadyPramod , MD  [facility] [address] [place], [place] [id number]  Phone [phone] Fax [phone] Note: This document was prepared with digital dictation and possible smart phrase technology. Any transcriptional errors that result from this process are unintentional.

## 2019-05-30 NOTE — Patient Instructions (Signed)
I had a long d/w patient about his recent cerebellar stroke, risk for recurrent stroke/TIAs, personally independently reviewed imaging studies and stroke evaluation results and answered questions.Continue aspirin 81 mg daily  for secondary stroke prevention and maintain strict control of hypertension with blood pressure goal below 130/90, diabetes with hemoglobin A1c goal below 6.5% and lipids with LDL cholesterol goal below 70 mg/dL. I also advised the patient to eat a healthy diet with plenty of whole grains, cereals, fruits and vegetables, exercise regularly and maintain ideal body weight .  Recommend we check MRI of the brain and neck to rule out intracranial and extracranial stenosis as well as hemoglobin A1c and follow-up lipid profile.  Also 30-day cardiac event monitoring for paroxysmal arrhythmias.  Followup in the future with me in 2 months or call earlier if necessary.  Stroke Prevention Some medical conditions and behaviors are associated with a higher chance of having a stroke. You can help prevent a stroke by making nutrition, lifestyle, and other changes, including managing any medical conditions you may have. What nutrition changes can be made?   Eat healthy foods. You can do this by: ? Choosing foods high in fiber, such as fresh fruits and vegetables and whole grains. ? Eating at least 5 or more servings of fruits and vegetables a day. Try to fill half of your plate at each meal with fruits and vegetables. ? Choosing lean protein foods, such as lean cuts of meat, poultry without skin, fish, tofu, beans, and nuts. ? Eating low-fat dairy products. ? Avoiding foods that are high in salt (sodium). This can help lower blood pressure. ? Avoiding foods that have saturated fat, trans fat, and cholesterol. This can help prevent high cholesterol. ? Avoiding processed and premade foods.  Follow your health care provider's specific guidelines for losing weight, controlling high blood pressure  (hypertension), lowering high cholesterol, and managing diabetes. These may include: ? Reducing your daily calorie intake. ? Limiting your daily sodium intake to 1,500 milligrams (mg). ? Using only healthy fats for cooking, such as olive oil, canola oil, or sunflower oil. ? Counting your daily carbohydrate intake. What lifestyle changes can be made?  Maintain a healthy weight. Talk to your health care provider about your ideal weight.  Get at least 30 minutes of moderate physical activity at least 5 days a week. Moderate activity includes brisk walking, biking, and swimming.  Do not use any products that contain nicotine or tobacco, such as cigarettes and e-cigarettes. If you need help quitting, ask your health care provider. It may also be helpful to avoid exposure to secondhand smoke.  Limit alcohol intake to no more than 1 drink a day for nonpregnant women and 2 drinks a day for men. One drink equals 12 oz of beer, 5 oz of wine, or 1 oz of hard liquor.  Stop any illegal drug use.  Avoid taking birth control pills. Talk to your health care provider about the risks of taking birth control pills if: ? You are over 68 years old. ? You smoke. ? You get migraines. ? You have ever had a blood clot. What other changes can be made?  Manage your cholesterol levels. ? Eating a healthy diet is important for preventing high cholesterol. If cholesterol cannot be managed through diet alone, you may also need to take medicines. ? Take any prescribed medicines to control your cholesterol as told by your health care provider.  Manage your diabetes. ? Eating a healthy diet and exercising regularly  are important parts of managing your blood sugar. If your blood sugar cannot be managed through diet and exercise, you may need to take medicines. ? Take any prescribed medicines to control your diabetes as told by your health care provider.  Control your hypertension. ? To reduce your risk of stroke, try  to keep your blood pressure below 130/80. ? Eating a healthy diet and exercising regularly are an important part of controlling your blood pressure. If your blood pressure cannot be managed through diet and exercise, you may need to take medicines. ? Take any prescribed medicines to control hypertension as told by your health care provider. ? Ask your health care provider if you should monitor your blood pressure at home. ? Have your blood pressure checked every year, even if your blood pressure is normal. Blood pressure increases with age and some medical conditions.  Get evaluated for sleep disorders (sleep apnea). Talk to your health care provider about getting a sleep evaluation if you snore a lot or have excessive sleepiness.  Take over-the-counter and prescription medicines only as told by your health care provider. Aspirin or blood thinners (antiplatelets or anticoagulants) may be recommended to reduce your risk of forming blood clots that can lead to stroke.  Make sure that any other medical conditions you have, such as atrial fibrillation or atherosclerosis, are managed. What are the warning signs of a stroke? The warning signs of a stroke can be easily remembered as BEFAST.  B is for balance. Signs include: ? Dizziness. ? Loss of balance or coordination. ? Sudden trouble walking.  E is for eyes. Signs include: ? A sudden change in vision. ? Trouble seeing.  F is for face. Signs include: ? Sudden weakness or numbness of the face. ? The face or eyelid drooping to one side.  A is for arms. Signs include: ? Sudden weakness or numbness of the arm, usually on one side of the body.  S is for speech. Signs include: ? Trouble speaking (aphasia). ? Trouble understanding.  T is for time. ? These symptoms may represent a serious problem that is an emergency. Do not wait to see if the symptoms will go away. Get medical help right away. Call your local emergency services (911 in the  [place] Do not drive yourself to the hospital.  Other signs of stroke may include: ? A sudden, severe headache with no known cause. ? Nausea or vomiting. ? Seizure. Where to find more information For more information, visit:  American Stroke Association: www.strokeassociation.org  National Stroke Association: www.stroke.org Summary  You can prevent a stroke by eating healthy, exercising, not smoking, limiting alcohol intake, and managing any medical conditions you may have.  Do not use any products that contain nicotine or tobacco, such as cigarettes and e-cigarettes. If you need help quitting, ask your health care provider. It may also be helpful to avoid exposure to secondhand smoke.  Remember BEFAST for warning signs of stroke. Get help right away if you or a loved one has any of these signs. This information is not intended to replace advice given to you by your health care provider. Make sure you discuss any questions you have with your health care provider. Document Released: 12/09/2004 Document Revised: 10/14/2017 Document Reviewed: 12/07/2016 Elsevier Patient Education  2020 [employer].

## 2019-05-31 LAB — LIPID PANEL
Chol/HDL Ratio: 3.9 ratio (ref 0.0–5.0)
Cholesterol, Total: 156 mg/dL (ref 100–199)
HDL: 40 mg/dL (ref >39)
LDL Calculated: 94 mg/dL (ref 0–99)
Triglycerides: 111 mg/dL (ref 0–149)
VLDL Cholesterol Cal: 22 mg/dL (ref 5–40)

## 2019-05-31 LAB — HEMOGLOBIN A1C
Est. average glucose Bld gHb Est-mCnc: 120 mg/dL
Hgb A1c MFr Bld: 5.8 % — ABNORMAL HIGH (ref 4.8–5.6)

## 2019-06-01 ENCOUNTER — Other Ambulatory Visit (HOSPITAL_COMMUNITY): Payer: Self-pay | Admitting: Neurology

## 2019-06-05 ENCOUNTER — Telehealth: Payer: Self-pay | Admitting: Neurology

## 2019-06-05 ENCOUNTER — Telehealth: Payer: Self-pay

## 2019-06-05 NOTE — Telephone Encounter (Signed)
-----   Message from Garvin Fila, MD sent at 06/01/2019  2:34 PM EDT ----- Kenneth Pham inform the patient that the test for screening for diabetes was satisfactory but cholesterol profile shows the bad cholesterol is still slightly elevated and not at goal.  I recommend he increase the dose of Lipitor to 60 mg daily.

## 2019-06-05 NOTE — Telephone Encounter (Signed)
spoke to pt due to the cost he will get back to me  Dillon Bjork: [id number] (exp. 06/04/19 to 11/30/19)

## 2019-06-05 NOTE — Telephone Encounter (Signed)
I called pt that his screening for diabetes was satisfactory. The cholesterol profile shows bad cholesterol slightly elevated not at goal. He recommends to increase the dosage to 60mg  daily of lipitor. Pt verbalized understanding. Pt will call his PCP about the change.  ------

## 2019-06-08 DIAGNOSIS — R7303 Prediabetes: Secondary | ICD-10-CM

## 2019-06-08 HISTORY — DX: Prediabetes: R73.03

## 2019-06-26 ENCOUNTER — Ambulatory Visit: Payer: BC Managed Care – PPO | Admitting: Cardiology

## 2019-07-05 ENCOUNTER — Telehealth: Payer: Self-pay | Admitting: Cardiology

## 2019-07-05 MED ORDER — SACUBITRIL-VALSARTAN 49-51 MG PO TABS: 1.0000 | ORAL_TABLET | Freq: Two times a day (BID) | ORAL | 1 refills | Status: DC

## 2019-07-05 NOTE — Telephone Encounter (Signed)
Refill for [facility]resto sent to [facility] in [place] as requested.

## 2019-07-05 NOTE — Addendum Note (Signed)
Addended by: Austin Miles on: 07/05/2019 12:08 PM   Modules accepted: Orders

## 2019-07-05 NOTE — Telephone Encounter (Signed)
Please call in his Delene Loll the the [facility] [employer], we gave him samples nad he needs a script please.Marland Kitchen

## 2019-07-09 NOTE — Progress Notes (Signed)
Cardiology Office Note:    Date:  07/10/2019   ID:  Kenneth Pham, DOB 28-Nov-1955, MRN [id number]  PCP:  Olive Bassough, Robert L, MD  Cardiologist:  Norman HerrlichBrian Bryse Blanchette, MD    Referring MD: Olive Bassough, Robert L, MD    ASSESSMENT:    1. Chronic systolic heart failure (HCC)   2. Hypertensive heart disease with heart failure (HCC)   3. Dilated cardiomyopathy (HCC)   4. Hyperkalemia   5. CKD (chronic kidney disease) stage 2, GFR 60-89 ml/min    PLAN:    In order of problems listed above:  1. Improved New York Heart Association class II still has exertional dyspnea had an episode of orthopnea last week but is walking outdoors and can climb a flight of stairs without stopping he has no fluid overload continue his current loop diuretic high potassium discontinue spironolactone recheck today and advance his Entresto.  We will plan to recheck an echocardiogram in 4 weeks if EF remains severely reduced consider ICD and left and right heart catheterization.  If potassium greater than 5.6 will need to stop spironolactone and if need be use of resin binder to continue his Entresto.  Is good healthcare literacy he restrict sodium weighs daily. 2. Stable hypertension continue guideline directed treatment 3. Severe he is all but discontinued alcohol strongly encouraged him for total abstinence is a never an alcoholic cardiomyopathy and uptitrate his Entresto recheck echo and decision regarding ICD. 4. Discontinue MRA if K greater than 5.6 discontinue and if need be a resin binder to keep him on Entresto 5. Recheck renal function today   Next appointment: 6 weeks after echocardiogram   Medication Adjustments/Labs and Tests Ordered: Current medicines are reviewed at length with the patient today.  Concerns regarding medicines are outlined above.  No orders of the defined types were placed in this encounter.  No orders of the defined types were placed in this encounter.   Chief Complaint  Patient presents with  .  Follow-up  . Congestive Heart Failure  . Cardiomyopathy  . Hypertension    History of Present Illness:    Kenneth Pham is a 63 y.o. male with a hx of  hypertension and hyperlipidemia.  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  MRA and admission showed right vertebral artery stenosis and less than 50% right proximal ICA stenosis.   I reviewed the echocardi[facility] performed during the admission EF estimated 20% associated with a low level static troponin elevation.  He was seen in consultation by a cardiologist 05/02/2019 there is low-level troponin elevation ranging 0.10-0.17 BNP level is elevated at 6910 EKG showed left ventricular hypertrophy and repolarization and echocardiogram is described as global hypokinesia ejection fraction 20%.  CT scan showed stroke in the right cerebellum.  His past history was noteworthy for hypertension and congestive heart failure he was on no home medications when he presented to the hospital MRI of the head and neck showed acute infarct in the right cerebellum moderate narrowing of the right vertebral artery mild narrowing of both proximal internal carotid arteries less than 50%.  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  Medical  therapy was initiated with diuretic beta-blocker ACE inhibitor..  He had no ventricular arrhythmia noted while in hospital.  On presentation to the hospital his initial blood pressure was 182/101.   He was last seen by me in the office 05/21/2019 with decompensated heart failure.  With  his severe cardiomyopathy I asked him to resume a loop diuretic and transition from ACE inhibitor to [place].  We initiated discussion about the potential for an ICD and made arrangements for office follow-up in 1 week.  He also has mild bilateral carotid stenosis will require a follow-up cerebrovascular duplex in 1 year He was last  seen 05/29/2019. Compliance with diet, lifestyle and medications: Yes  Weight stable except 1 day 1 of 4 pounds in the setting of dietary indiscretion.  He is better he had one episode of PND week ago and still has mild shortness of breath and can climb a flight of stairs and walks outdoors every day and plays with grandchildren.  No edema chest pain palpitation or syncope. Past Medical History:  Diagnosis Date  . Acute CHF ([place]) 05/16/2019  . Alcohol dependence in remission ([place]) 05/16/2019  . Arteriosclerosis of carotid artery, right 05/15/2019  . Cerebellar stroke, acute ([place]) 05/09/2019  . Heart failure with reduced ejection fraction ([place]) 05/09/2019  . Hypercholesteremia   . Hypertension   . Pulmonary nodules 05/15/2019   05/01/2019 CT angio - 8.36mm bandlike nodular density lingula - 5.3mm R middle lobe - 61.5 pack year hx, rec 3 month f/u    Past Surgical History:  Procedure Laterality Date  . APPENDECTOMY    . CATARACT EXTRACTION Right   . TONSILLECTOMY      Current Medications: Current Meds  Medication Sig  . aspirin EC 81 MG tablet Take 81 mg by mouth daily.   Marland Kitchen atorvastatin (LIPITOR) 80 MG tablet TAKE 1 TABLET (80 MG TOTAL) BY MOUTH DAILY. CHOLESTEROL  . furosemide (LASIX) 20 MG tablet Take 1 tablet (20 mg total) by mouth 2 (two) times daily.  . metoprolol succinate (TOPROL-XL) 25 MG 24 hr tablet Take 25 mg by mouth daily.   . sacubitril-valsartan (ENTRESTO) 49-51 MG Take 1 tablet by mouth 2 (two) times daily.  Marland Kitchen spironolactone (ALDACTONE) 25 MG tablet Take 0.5 tablets (12.5 mg total) by mouth daily.  Marland Kitchen thiamine (VITAMIN B-1) 100 MG tablet Take 100 mg by mouth daily.      Allergies:   Patient has no known allergies.   Social History   Socioeconomic History  . Marital status: Legally Separated    Spouse name: Not on file  . Number of children: Not on file  . Years of education: Not on file  . Highest education level: Not on file  Occupational History  . Not on file   Social Needs  . Financial resource strain: Not on file  . Food insecurity    Worry: Not on file    Inability: Not on file  . Transportation needs    Medical: Not on file    Non-medical: Not on file  Tobacco Use  . Smoking status: Former Smoker    Types: Cigarettes    Quit date: 04/29/2019    Years since quitting: 0.1  . Smokeless tobacco: Never Used  Substance and Sexual Activity  . Alcohol use: Not Currently    Alcohol/week: 14.0 standard drinks    Types: 14 Cans of beer per week  . Drug use: Not Currently    Types: Marijuana  . Sexual activity: Not on file  Lifestyle  . Physical activity    Days per week: Not on file    Minutes per session: Not on file  . Stress: Not on file  Relationships  . Social Herbalist on phone: Not on file    Gets  together: Not on file    Attends religious service: Not on file    Active member of club or organization: Not on file    Attends meetings of clubs or organizations: Not on file    Relationship status: Not on file  Other Topics Concern  . Not on file  Social History Narrative  . Not on file     Family History: The patient's family history includes Cancer in his father; Heart Problems in his brother; Stroke in his mother. ROS:   Please see the history of present illness.    All other systems reviewed and are negative.  EKGs/Labs/Other Studies Reviewed:    The following studies were reviewed today:  Change  Recent Labs: 05/29/2019: BUN 21; Creatinine, Ser 1.22; Hemoglobin 17.1; NT-Pro BNP 1,685; Platelets 354; Potassium 6.0; Sodium 141  Recent Lipid Panel    Component Value Date/Time   CHOL 156 05/30/2019 1348   TRIG 111 05/30/2019 1348   HDL 40 05/30/2019 1348   CHOLHDL 3.9 05/30/2019 1348   LDLCALC 94 05/30/2019 1348    Physical Exam:    VS:  BP 132/82 (BP Location: Right Arm, Patient Position: Sitting, Cuff Size: Normal)   Pulse 90   Ht 5\' 10"  (1.778 m)   Wt 178 lb (80.7 kg)   SpO2 98%   BMI 25.54  kg/m     Wt Readings from Last 3 Encounters:  07/10/19 178 lb (80.7 kg)  05/30/19 174 lb (78.9 kg)  05/29/19 174 lb (78.9 kg)     GEN:  Well nourished, well developed in no acute distress HEENT: Normal NECK: No JVD; No carotid bruits LYMPHATICS: No lymphadenopathy CARDIAC: He has no S3 or murmur RRR, no murmurs, rubs, gallops RESPIRATORY:  Clear to auscultation without rales, wheezing or rhonchi  ABDOMEN: Soft, non-tender, non-distended MUSCULOSKELETAL:  No edema; No deformity  SKIN: Warm and dry NEUROLOGIC:  Alert and oriented x 3 PSYCHIATRIC:  Normal affect    Signed, Norman Herrlich, MD  07/10/2019 1:55 PM    [facility]

## 2019-07-10 ENCOUNTER — Ambulatory Visit (INDEPENDENT_AMBULATORY_CARE_PROVIDER_SITE_OTHER): Payer: BC Managed Care – PPO | Admitting: Cardiology

## 2019-07-10 ENCOUNTER — Other Ambulatory Visit: Payer: Self-pay

## 2019-07-10 ENCOUNTER — Encounter: Payer: Self-pay | Admitting: Cardiology

## 2019-07-10 VITALS — BP 132/82 | HR 90 | Ht 70.0 in | Wt 178.0 lb

## 2019-07-10 DIAGNOSIS — I110 Hypertensive heart disease with heart failure: Secondary | ICD-10-CM

## 2019-07-10 DIAGNOSIS — I5022 Chronic systolic (congestive) heart failure: Secondary | ICD-10-CM | POA: Diagnosis not present

## 2019-07-10 DIAGNOSIS — I420 Dilated cardiomyopathy: Secondary | ICD-10-CM | POA: Diagnosis not present

## 2019-07-10 DIAGNOSIS — E875 Hyperkalemia: Secondary | ICD-10-CM | POA: Diagnosis not present

## 2019-07-10 DIAGNOSIS — E7800 Pure hypercholesterolemia, unspecified: Secondary | ICD-10-CM

## 2019-07-10 DIAGNOSIS — N182 Chronic kidney disease, stage 2 (mild): Secondary | ICD-10-CM

## 2019-07-10 MED ORDER — FUROSEMIDE 20 MG PO TABS: 20.0000 mg | ORAL_TABLET | Freq: Two times a day (BID) | ORAL | 3 refills | Status: DC

## 2019-07-10 MED ORDER — SACUBITRIL-VALSARTAN 97-103 MG PO TABS: 1.0000 | ORAL_TABLET | Freq: Two times a day (BID) | ORAL | 3 refills | Status: DC

## 2019-07-10 NOTE — Patient Instructions (Signed)
Medication Instructions:  Your physician has recommended you make the following change in your medication:   STOP spironolactone (aldactone)   INCREASE sacubitril-valsartan (entresto) 97-103 mg: Take 1 tablet twice daily  If you need a refill on your cardiac medications before your next appointment, please call your pharmacy.   Lab work: Your physician recommends that you return for lab work today: CMP, lipid profile, ProBNP.   If you have labs (blood work) drawn today and your tests are completely normal, you will receive your results only by: Marland Kitchen MyChart Message (if you have MyChart) OR . A paper copy in the mail If you have any lab test that is abnormal or we need to change your treatment, we will call you to review the results.  Testing/Procedures: Your physician has requested that you have an echocardiogram. Echocardiography is a painless test that uses sound waves to create images of your heart. It provides your doctor with information about the size and shape of your heart and how well your heart's chambers and valves are working. This procedure takes approximately one hour. There are no restrictions for this procedure.  Follow-Up: At [facility], you and your health needs are our priority.  As part of our continuing mission to provide you with exceptional heart care, we have created designated Provider Care Teams.  These Care Teams include your primary Cardiologist (physician) and Advanced Practice Providers (APPs -  Physician Assistants and Nurse Practitioners) who all work together to provide you with the care you need, when you need it. You will need a follow up appointment in 6 weeks.       Echocardiogram An echocardiogram is a procedure that uses painless sound waves (ultrasound) to produce an image of the heart. Images from an echocardiogram can provide important information about:  Signs of coronary artery disease (CAD).  Aneurysm detection. An aneurysm is a weak or  damaged part of an artery wall that bulges out from the normal force of blood pumping through the body.  Heart size and shape. Changes in the size or shape of the heart can be associated with certain conditions, including heart failure, aneurysm, and CAD.  Heart muscle function.  Heart valve function.  Signs of a past heart attack.  Fluid buildup around the heart.  Thickening of the heart muscle.  A tumor or infectious growth around the heart valves. Tell a health care provider about:  Any allergies you have.  All medicines you are taking, including vitamins, herbs, eye drops, creams, and over-the-counter medicines.  Any blood disorders you have.  Any surgeries you have had.  Any medical conditions you have.  Whether you are pregnant or may be pregnant. What are the risks? Generally, this is a safe procedure. However, problems may occur, including:  Allergic reaction to dye (contrast) that may be used during the procedure. What happens before the procedure? No specific preparation is needed. You may eat and drink normally. What happens during the procedure?   An IV tube may be inserted into one of your veins.  You may receive contrast through this tube. A contrast is an injection that improves the quality of the pictures from your heart.  A gel will be applied to your chest.  A wand-like tool (transducer) will be moved over your chest. The gel will help to transmit the sound waves from the transducer.  The sound waves will harmlessly bounce off of your heart to allow the heart images to be captured in real-time motion. The  images will be recorded on a computer. The procedure may vary among health care providers and hospitals. What happens after the procedure?  You may return to your normal, everyday life, including diet, activities, and medicines, unless your health care provider tells you not to do that. Summary  An echocardiogram is a procedure that uses painless  sound waves (ultrasound) to produce an image of the heart.  Images from an echocardiogram can provide important information about the size and shape of your heart, heart muscle function, heart valve function, and fluid buildup around your heart.  You do not need to do anything to prepare before this procedure. You may eat and drink normally.  After the echocardiogram is completed, you may return to your normal, everyday life, unless your health care provider tells you not to do that. This information is not intended to replace advice given to you by your health care provider. Make sure you discuss any questions you have with your health care provider. Document Released: 10/29/2000 Document Revised: 02/22/2019 Document Reviewed: 12/04/2016 Elsevier Patient Education  2020 [employer].

## 2019-07-11 ENCOUNTER — Telehealth: Payer: Self-pay | Admitting: *Deleted

## 2019-07-11 DIAGNOSIS — I509 Heart failure, unspecified: Secondary | ICD-10-CM

## 2019-07-11 DIAGNOSIS — I110 Hypertensive heart disease with heart failure: Secondary | ICD-10-CM

## 2019-07-11 LAB — COMPREHENSIVE METABOLIC PANEL
ALT: 25 IU/L (ref 0–44)
AST: 20 IU/L (ref 0–40)
Albumin/Globulin Ratio: 2.2 (ref 1.2–2.2)
Albumin: 5 g/dL — ABNORMAL HIGH (ref 3.8–4.8)
Alkaline Phosphatase: 102 IU/L (ref 39–117)
BUN/Creatinine Ratio: 24 (ref 10–24)
BUN: 33 mg/dL — ABNORMAL HIGH (ref 8–27)
Bilirubin Total: 0.3 mg/dL (ref 0.0–1.2)
CO2: 23 mmol/L (ref 20–29)
Calcium: 9.8 mg/dL (ref 8.6–10.2)
Chloride: 102 mmol/L (ref 96–106)
Creatinine, Ser: 1.38 mg/dL — ABNORMAL HIGH (ref 0.76–1.27)
GFR calc Af Amer: 63 mL/min/{1.73_m2} (ref >59)
GFR calc non Af Amer: 54 mL/min/{1.73_m2} — ABNORMAL LOW (ref >59)
Globulin, Total: 2.3 g/dL (ref 1.5–4.5)
Glucose: 102 mg/dL — ABNORMAL HIGH (ref 65–99)
Potassium: 5.9 mmol/L — ABNORMAL HIGH (ref 3.5–5.2)
Sodium: 143 mmol/L (ref 134–144)
Total Protein: 7.3 g/dL (ref 6.0–8.5)

## 2019-07-11 LAB — LIPID PANEL
Chol/HDL Ratio: 4.1 ratio (ref 0.0–5.0)
Cholesterol, Total: 155 mg/dL (ref 100–199)
HDL: 38 mg/dL — ABNORMAL LOW (ref >39)
LDL Calculated: 85 mg/dL (ref 0–99)
Triglycerides: 160 mg/dL — ABNORMAL HIGH (ref 0–149)
VLDL Cholesterol Cal: 32 mg/dL (ref 5–40)

## 2019-07-11 LAB — PRO B NATRIURETIC PEPTIDE: NT-Pro BNP: 1719 pg/mL — ABNORMAL HIGH (ref 0–210)

## 2019-07-11 NOTE — Telephone Encounter (Signed)
Telephone call to patient. Left detailed message per DPR . Informed of lab results and need for repeat BMP in 2 weeks and to call with any questions.

## 2019-07-11 NOTE — Telephone Encounter (Signed)
-----   Message from Richardo Priest, MD sent at 07/11/2019 10:36 AM EDT ----- Normal or stable result  His potassium is low elevated we stop spironolactone yesterday lets ask him to repeat a BMP only in about 2 weeks and the rest of his lab work looks very encouraging

## 2019-07-13 ENCOUNTER — Encounter: Payer: Self-pay | Admitting: *Deleted

## 2019-07-13 DIAGNOSIS — Z7901 Long term (current) use of anticoagulants: Secondary | ICD-10-CM

## 2019-07-13 HISTORY — DX: Long term (current) use of anticoagulants: Z79.01

## 2019-07-27 LAB — BASIC METABOLIC PANEL
BUN/Creatinine Ratio: 24 (ref 10–24)
BUN: 35 mg/dL — ABNORMAL HIGH (ref 8–27)
CO2: 22 mmol/L (ref 20–29)
Calcium: 9.6 mg/dL (ref 8.6–10.2)
Chloride: 104 mmol/L (ref 96–106)
Creatinine, Ser: 1.47 mg/dL — ABNORMAL HIGH (ref 0.76–1.27)
GFR calc Af Amer: 58 mL/min/{1.73_m2} — ABNORMAL LOW (ref >59)
GFR calc non Af Amer: 50 mL/min/{1.73_m2} — ABNORMAL LOW (ref >59)
Glucose: 124 mg/dL — ABNORMAL HIGH (ref 65–99)
Potassium: 5.7 mmol/L — ABNORMAL HIGH (ref 3.5–5.2)
Sodium: 139 mmol/L (ref 134–144)

## 2019-08-01 ENCOUNTER — Other Ambulatory Visit: Payer: Self-pay

## 2019-08-01 ENCOUNTER — Ambulatory Visit (INDEPENDENT_AMBULATORY_CARE_PROVIDER_SITE_OTHER): Payer: BC Managed Care – PPO

## 2019-08-01 DIAGNOSIS — I110 Hypertensive heart disease with heart failure: Secondary | ICD-10-CM

## 2019-08-01 DIAGNOSIS — I5022 Chronic systolic (congestive) heart failure: Secondary | ICD-10-CM

## 2019-08-01 DIAGNOSIS — I420 Dilated cardiomyopathy: Secondary | ICD-10-CM | POA: Diagnosis not present

## 2019-08-01 NOTE — Progress Notes (Signed)
Complete echocardiogram has been performed.  Jimmy Narayan Scull RDCS, RVT 

## 2019-08-06 ENCOUNTER — Telehealth: Payer: Self-pay | Admitting: *Deleted

## 2019-08-06 NOTE — Telephone Encounter (Signed)
Left message for patient to return call to discuss echocardiogram results and offer a sooner appointment with Dr. Bettina Gavia in the [facility] office.

## 2019-08-06 NOTE — Telephone Encounter (Signed)
Patient returned call and was informed of his echocardiogram results. Patient is unable to come to the [facility] office due to transportation issues. Patient will keep his follow up appointment in [place] as scheduled on 09/18/2019 at 1:55 pm with Dr. Bettina Gavia. Patient is agreeable to plan and verbalized understanding. No further questions.

## 2019-08-06 NOTE — Telephone Encounter (Signed)
-----   Message from Kenneth Dubonnet, NP sent at 08/03/2019  5:07 PM EDT ----- He has appointment 09/18/19 with you for 6 week follow up. Would you like sooner? If he is willing to travel to [place] you have appt 10/2 or 10/22.  ----- Message ----- From: Richardo Priest, MD Sent: 08/03/2019   4:25 PM EDT To: Austin Miles, RN, Kenneth Dubonnet, NP, #  Better but EF remains <35. Please be sure to see me office re heart Cath and referral to ICD

## 2019-08-07 ENCOUNTER — Ambulatory Visit: Payer: Self-pay | Admitting: Neurology

## 2019-08-19 NOTE — Progress Notes (Signed)
Cardiology Office Note:    Date:  08/21/2019   ID:  Kenneth Pham, DOB [birth year], MRN [id number]  PCP:  Olive Bass, MD  Cardiologist:  Norman Herrlich, MD    Referring MD: Olive Bass, MD    ASSESSMENT:    1. Chronic systolic heart failure (HCC)   2. Hypertensive heart disease with heart failure (HCC)   3. Dilated cardiomyopathy (HCC)   4. CKD (chronic kidney disease) stage 2, GFR 60-89 ml/min    PLAN:   He has chronic systolic heart failure has had a really favorable response to guideline directed treatment and is only short of breath when he walks quickly uphill he is now walking 15 to 30 minutes a day he has rare episodes of orthopnea but really feels better.  I think his anginal equivalent here shortness of breath and he requires an ischemia evaluation we reviewed the options and will choose cardiac CTA.  His shortness of breath is the equivalent of precordial chest pain.  In order of problems listed above:  1. Improved New York Heart Association class II continue current treatment including his loop diuretic high dose Entresto beta-blocker and with his failure to improve ejection fraction greater than 35 and ischemia evaluation cardiac CTA.  I will see him back in the office 6 weeks and address SGLT2 inhibitor at that time.  With underlying CKD we will recheck his renal function today 2. BP at target continue guideline directed therapy 3. Hyperlipidemia stable continue statin with stroke 4. Recheck renal function today I think that his renal function is stable and he will tolerate the dye load of CTA   Next appointment: 6 weeks   Medication Adjustments/Labs and Tests Ordered: Current medicines are reviewed at length with the patient today.  Concerns regarding medicines are outlined above.  No orders of the defined types were placed in this encounter.  No orders of the defined types were placed in this encounter.   Chief Complaint  Patient presents with  . Follow-up   . Congestive Heart Failure    History of Present Illness:    Kenneth Pham is a 63 y.o. male with a hx of hypertension and hyperlipidemia.  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  MRA and admission showed right vertebral artery stenosis and less than 50% right proximal ICA stenosis.   I reviewed the echocardiogram from [facility] performed during the admission EF estimated 20% associated with a low level static troponin elevation.   He was seen in consultation by a cardiologist 05/02/2019 there is low-level troponin elevation ranging 0.10-0.17 BNP level is elevated at 6910 EKG showed left ventricular hypertrophy and repolarization and echocardiogram is described as global hypokinesia ejection fraction 20%.  CT scan showed stroke in the right cerebellum.  His past history was noteworthy for hypertension and congestive heart failure he was on no home medications when he presented to the hospital MRI of the head and neck showed acute infarct in the right cerebellum moderate narrowing of the right vertebral artery mild narrowing of both proximal internal carotid arteries less than 50%.  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  Medical  therapy was initiated with diuretic beta-blocker ACE inhibitor..  He had no ventricular arrhythmia noted while in hospital.He was last seen 07/10/2019. Compliance with diet, lifestyle and medications: Yes  Overall he continues to improve walks daily 15 to 30 minutes he short of breath uphill faster  pace and rarely has orthopnea but his weights are stable no edema chest pain palpitation or syncope.  He has vascular disease I think your shortness of breath is anginal equivalent and he needs an ischemia evaluation.  Options benefits and risks detailed in shared decision making to perform cardiac CTA.  We will recheck renal function proBNP today  Echo 07/22/2019:   1. The left ventricle has moderate-severely reduced systolic function, with an ejection fraction of 30-35%. There is diffuse hypokinesis of the Left ventricle. There is mild concentric left ventricular hypertrophy. Left ventricular diastolic Doppler  parameters are consistent with pseudonormalization. Elevated mean left atrial pressure.  2. There is abnormal global logitudinal strain (-7.5%).  3. The right ventricle has normal systolic function.  4. Left atrial and Right atrial size was normal.  5. Mild thickening of the aortic valve. Mild calcification of the aortic valve. Aortic valve regurgitation was not visualized by color flow Doppler.No aortic stenosis.  Ref Pham & Units 54mo ago 7mo ago 72mo ago   NT-Pro BNP 0 - 210 pg/mL 1,719High   1,685High  CM  9,196High  CM     Ref Pham & Units 3wk ago 86mo ago 75mo ago  Glucose 65 - 99 mg/dL 500XFGH   829HBZJ   696VELF    BUN 8 - 27 mg/dL 81OFBP   10CHEN   21   Creatinine, Ser 0.76 - 1.27 mg/dL 1.47High   1.38High   1.22   GFR calc non Af Amer >59 mL/min/1.73 50Low   54Low   63   GFR calc Af Amer >59 mL/min/1.73 58Low   63  73   BUN/Creatinine Ratio 10 - 24 24  24  17    Sodium 134 - 144 mmol/L 139  143  141   Potassium 3.5 - 5.2 mmol/L 5.7High   5.9High   6.0High    Chloride 96 - 106 mmol/L 104  102  101   CO2 20 - 29 mmol/L 22  23  23    Calcium 8.6 - 10.2 mg/dL 9.6  9.8  10     Past Medical History:  Diagnosis Date  . Acute CHF (HCC) 05/16/2019  . Alcohol dependence in remission (HCC) 05/16/2019  . Arteriosclerosis of carotid artery, right 05/15/2019  . Cerebellar stroke, acute (HCC) 05/09/2019  . Heart failure with reduced ejection fraction (HCC) 05/09/2019  . Hypercholesteremia   . Hypertension   . Pulmonary nodules 05/15/2019   05/01/2019 CT angio - 8.76mm bandlike nodular density lingula - 5.17mm R middle lobe - 61.5 pack year hx, rec 3 month f/u    Past Surgical History:  Procedure Laterality Date  . APPENDECTOMY    . CATARACT  EXTRACTION Right   . TONSILLECTOMY      Current Medications: Current Meds  Medication Sig  . aspirin EC 81 MG tablet Take 81 mg by mouth daily.   11m atorvastatin (LIPITOR) 80 MG tablet TAKE 1 TABLET (80 MG TOTAL) BY MOUTH DAILY. CHOLESTEROL  . furosemide (LASIX) 20 MG tablet Take 1 tablet (20 mg total) by mouth 2 (two) times daily.  . metoprolol succinate (TOPROL-XL) 25 MG 24 hr tablet Take 25 mg by mouth daily.   . sacubitril-valsartan (ENTRESTO) 97-103 MG Take 1 tablet by mouth 2 (two) times daily.  1m thiamine (VITAMIN B-1) 100 MG tablet Take 100 mg by mouth daily.      Allergies:   Patient has no known allergies.   Social History   Socioeconomic History  . Marital status: Legally  Separated    Spouse name: Not on file  . Number of children: Not on file  . Years of education: Not on file  . Highest education level: Not on file  Occupational History  . Not on file  Social Needs  . Financial resource strain: Not on file  . Food insecurity    Worry: Not on file    Inability: Not on file  . Transportation needs    Medical: Not on file    Non-medical: Not on file  Tobacco Use  . Smoking status: Former Smoker    Types: Cigarettes    Quit date: 04/29/2019    Years since quitting: 0.3  . Smokeless tobacco: Never Used  Substance and Sexual Activity  . Alcohol use: Not Currently  . Drug use: Not Currently    Types: Marijuana  . Sexual activity: Not on file  Lifestyle  . Physical activity    Days per week: Not on file    Minutes per session: Not on file  . Stress: Not on file  Relationships  . Social Herbalist on phone: Not on file    Gets together: Not on file    Attends religious service: Not on file    Active member of club or organization: Not on file    Attends meetings of clubs or organizations: Not on file    Relationship status: Not on file  Other Topics Concern  . Not on file  Social History Narrative  . Not on file     Family History: The  patient's family history includes Cancer in his father; Heart Problems in his brother; Stroke in his mother. ROS:   Please see the history of present illness.    All other systems reviewed and are negative.  EKGs/Labs/Other Studies Reviewed:    The following studies were reviewed today:   Recent Labs: 05/29/2019: Hemoglobin 17.1; Platelets 354 07/10/2019: ALT 25; NT-Pro BNP 1,719 07/26/2019: BUN 35; Creatinine, Ser 1.47; Potassium 5.7; Sodium 139  Recent Lipid Panel    Component Value Date/Time   CHOL 155 07/10/2019 1411   TRIG 160 (H) 07/10/2019 1411   HDL 38 (L) 07/10/2019 1411   CHOLHDL 4.1 07/10/2019 1411   LDLCALC 85 07/10/2019 1411    Physical Exam:    VS:  BP 130/76 (BP Location: Right Arm, Patient Position: Sitting, Cuff Size: Normal)   Pulse 96   Ht 5\' 10"  (1.778 m)   Wt 180 lb 9.6 oz (81.9 kg)   SpO2 97%   BMI 25.91 kg/m     Wt Readings from Last 3 Encounters:  08/21/19 180 lb 9.6 oz (81.9 kg)  07/10/19 178 lb (80.7 kg)  05/30/19 174 lb (78.9 kg)     GEN: He appears stronger well nourished, well developed in no acute distress HEENT: Normal NECK: No JVD; No carotid bruits LYMPHATICS: No lymphadenopathy CARDIAC: Distant heart sounds RRR, no murmurs, rubs, gallops RESPIRATORY:  Clear to auscultation without rales, wheezing or rhonchi  ABDOMEN: Soft, non-tender, non-distended MUSCULOSKELETAL:  No edema; No deformity  SKIN: Warm and dry NEUROLOGIC:  Alert and oriented x 3 PSYCHIATRIC:  Normal affect    Signed, Shirlee More, MD  08/21/2019 4:48 PM    [facility]

## 2019-08-21 ENCOUNTER — Ambulatory Visit (INDEPENDENT_AMBULATORY_CARE_PROVIDER_SITE_OTHER): Payer: BC Managed Care – PPO | Admitting: Cardiology

## 2019-08-21 ENCOUNTER — Other Ambulatory Visit: Payer: Self-pay

## 2019-08-21 ENCOUNTER — Encounter: Payer: Self-pay | Admitting: Cardiology

## 2019-08-21 VITALS — BP 130/76 | HR 96 | Ht 70.0 in | Wt 180.6 lb

## 2019-08-21 DIAGNOSIS — I110 Hypertensive heart disease with heart failure: Secondary | ICD-10-CM

## 2019-08-21 DIAGNOSIS — Z01812 Encounter for preprocedural laboratory examination: Secondary | ICD-10-CM

## 2019-08-21 DIAGNOSIS — R0602 Shortness of breath: Secondary | ICD-10-CM

## 2019-08-21 DIAGNOSIS — I5022 Chronic systolic (congestive) heart failure: Secondary | ICD-10-CM

## 2019-08-21 DIAGNOSIS — R072 Precordial pain: Secondary | ICD-10-CM

## 2019-08-21 DIAGNOSIS — N182 Chronic kidney disease, stage 2 (mild): Secondary | ICD-10-CM

## 2019-08-21 DIAGNOSIS — I420 Dilated cardiomyopathy: Secondary | ICD-10-CM

## 2019-08-21 HISTORY — DX: Shortness of breath: R06.02

## 2019-08-21 HISTORY — DX: Precordial pain: R07.2

## 2019-08-21 MED ORDER — METOPROLOL SUCCINATE ER 25 MG PO TB24: 25.0000 mg | ORAL_TABLET | Freq: Every day | ORAL | 0 refills | Status: DC

## 2019-08-21 MED ORDER — FUROSEMIDE 20 MG PO TABS: 20.0000 mg | ORAL_TABLET | Freq: Two times a day (BID) | ORAL | 0 refills | Status: DC

## 2019-08-21 MED ORDER — ATORVASTATIN CALCIUM 80 MG PO TABS: ORAL_TABLET | ORAL | 0 refills | Status: DC

## 2019-08-21 MED ORDER — SACUBITRIL-VALSARTAN 97-103 MG PO TABS: 1.0000 | ORAL_TABLET | Freq: Two times a day (BID) | ORAL | 0 refills | Status: DC

## 2019-08-21 NOTE — Patient Instructions (Signed)
Medication Instructions:  Your physician recommends that you continue on your current medications as directed. Please refer to the Current Medication list given to you today.  If you need a refill on your cardiac medications before your next appointment, please call your pharmacy.   Lab work: Your physician recommends that you return for lab work today: BMP, ProBNP.   If you have labs (blood work) drawn today and your tests are completely normal, you will receive your results only by: Marland Kitchen MyChart Message (if you have MyChart) OR . A paper copy in the mail If you have any lab test that is abnormal or we need to change your treatment, we will call you to review the results.  Testing/Procedures: You had an EKG today.   Your physician has requested that you have cardiac CT. Cardiac computed tomography (CT) is a painless test that uses an x-ray machine to take clear, detailed pictures of your heart. For further information please visit [email or web address] Please follow instruction sheet as given.  [facility] [address] [place], [place] [address] [phone]   If scheduled at [facility], please arrive at the [facility] main entrance of [facility] 30-45 minutes prior to test start time. Proceed to the [facility] Radiology Department (first floor) to check-in and test prep.   Please follow these instructions carefully (unless otherwise directed):  Hold all erectile dysfunction medications at least 3 days (72 hrs) prior to test.  On the Night Before the Test: . Be sure to Drink plenty of water. . Do not consume any caffeinated/decaffeinated beverages or chocolate 12 hours prior to your test. . Do not take any antihistamines 12 hours prior to your test.  On the Day of the Test: . Drink plenty of water. Do not drink any water within one hour of the test. . Do not eat any food 4 hours prior to the test. . You may take your regular medications prior to  the test.  . Take metoprolol (Lopressor) two hours prior to test. . HOLD Furosemide morning of the test.                  -If HR is less than 55 BPM- No Beta Blocker                -IF HR is greater than 55 BPM and patient is less than or equal to 62 yrs old Lopressor 100mg  x1.                -If HR is greater than 55 BPM and patient is greater than 62 yrs old Lopressor 50 mg x1.        After the Test: . Drink plenty of water. . After receiving IV contrast, you may experience a mild flushed feeling. This is normal. . On occasion, you may experience a mild rash up to 24 hours after the test. This is not dangerous. If this occurs, you can take Benadryl 25 mg and increase your fluid intake. . If you experience trouble breathing, this can be serious. If it is severe call 911 IMMEDIATELY. If it is mild, please call our office.    Please contact the cardiac imaging nurse navigator should you have any questions/concerns Rockwell Alexandria, RN Navigator Cardiac Imaging [facility] [phone] Office  [phone] Cell    Follow-Up: At [facility], you and your health needs are our priority.  As part of our continuing mission to provide  you with exceptional heart care, we have created designated Provider Care Teams.  These Care Teams include your primary Cardiologist (physician) and Advanced Practice Providers (APPs -  Physician Assistants and Nurse Practitioners) who all work together to provide you with the care you need, when you need it. You will need a follow up appointment in 6 weeks.       Cardiac CT Angiogram  A cardiac CT angiogram is a procedure to look at the heart and the area around the heart. It may be done to help find the cause of chest pains or other symptoms of heart disease. During this procedure, a large X-ray machine, called a CT scanner, takes detailed pictures of the heart and the surrounding area after a dye (contrast material) has been injected  into blood vessels in the area. The procedure is also sometimes called a coronary CT angiogram, coronary artery scanning, or CTA. A cardiac CT angiogram allows the health care provider to see how well blood is flowing to and from the heart. The health care provider will be able to see if there are any problems, such as:  Blockage or narrowing of the coronary arteries in the heart.  Fluid around the heart.  Signs of weakness or disease in the muscles, valves, and tissues of the heart. Tell a health care provider about:  Any allergies you have. This is especially important if you have had a previous allergic reaction to contrast dye.  All medicines you are taking, including vitamins, herbs, eye drops, creams, and over-the-counter medicines.  Any blood disorders you have.  Any surgeries you have had.  Any medical conditions you have.  Whether you are pregnant or may be pregnant.  Any anxiety disorders, chronic pain, or other conditions you have that may increase your stress or prevent you from lying still. What are the risks? Generally, this is a safe procedure. However, problems may occur, including:  Bleeding.  Infection.  Allergic reactions to medicines or dyes.  Damage to other structures or organs.  Kidney damage from the dye or contrast that is used.  Increased risk of cancer from radiation exposure. This risk is low. Talk with your health care provider about: ? The risks and benefits of testing. ? How you can receive the lowest dose of radiation. What happens before the procedure?  Wear comfortable clothing and remove any jewelry, glasses, dentures, and hearing aids.  Follow instructions from your health care provider about eating and drinking. This may include: ? For 12 hours before the test - avoid caffeine. This includes tea, coffee, soda, energy drinks, and diet pills. Drink plenty of water or other fluids that do not have caffeine in them. Being well-hydrated can  prevent complications. ? For 4-6 hours before the test - stop eating and drinking. The contrast dye can cause nausea, but this is less likely if your stomach is empty.  Ask your health care provider about changing or stopping your regular medicines. This is especially important if you are taking diabetes medicines, blood thinners, or medicines to treat erectile dysfunction. What happens during the procedure?  Hair on your chest may need to be removed so that small sticky patches called electrodes can be placed on your chest. These will transmit information that helps to monitor your heart during the test.  An IV tube will be inserted into one of your veins.  You might be given a medicine to control your heart rate during the test. This will help to ensure that  good images are obtained.  You will be asked to lie on an exam table. This table will slide in and out of the CT machine during the procedure.  Contrast dye will be injected into the IV tube. You might feel warm, or you may get a metallic taste in your mouth.  You will be given a medicine (nitroglycerin) to relax (dilate) the arteries in your heart.  The table that you are lying on will move into the CT machine tunnel for the scan.  The person running the machine will give you instructions while the scans are being done. You may be asked to: ? Keep your arms above your head. ? Hold your breath. ? Stay very still, even if the table is moving.  When the scanning is complete, you will be moved out of the machine.  The IV tube will be removed. The procedure may vary among health care providers and hospitals. What happens after the procedure?  You might feel warm, or you may get a metallic taste in your mouth from the contrast dye.  You may have a headache from the nitroglycerin.  After the procedure, drink water or other fluids to wash (flush) the contrast material out of your body.  Contact a health care provider if you have  any symptoms of allergy to the contrast. These symptoms include: ? Shortness of breath. ? Rash or hives. ? A racing heartbeat.  Most people can return to their normal activities right after the procedure. Ask your health care provider what activities are safe for you.  It is up to you to get the results of your procedure. Ask your health care provider, or the department that is doing the procedure, when your results will be ready. Summary  A cardiac CT angiogram is a procedure to look at the heart and the area around the heart. It may be done to help find the cause of chest pains or other symptoms of heart disease.  During this procedure, a large X-ray machine, called a CT scanner, takes detailed pictures of the heart and the surrounding area after a dye (contrast material) has been injected into blood vessels in the area.  Ask your health care provider about changing or stopping your regular medicines before the procedure. This is especially important if you are taking diabetes medicines, blood thinners, or medicines to treat erectile dysfunction.  After the procedure, drink water or other fluids to wash (flush) the contrast material out of your body. This information is not intended to replace advice given to you by your health care provider. Make sure you discuss any questions you have with your health care provider. Document Released: 10/14/2008 Document Revised: 10/14/2017 Document Reviewed: 09/20/2016 Elsevier Patient Education  2020 [employer].

## 2019-08-22 LAB — BASIC METABOLIC PANEL
BUN/Creatinine Ratio: 23 (ref 10–24)
BUN: 28 mg/dL — ABNORMAL HIGH (ref 8–27)
CO2: 23 mmol/L (ref 20–29)
Calcium: 10 mg/dL (ref 8.6–10.2)
Chloride: 103 mmol/L (ref 96–106)
Creatinine, Ser: 1.2 mg/dL (ref 0.76–1.27)
GFR calc Af Amer: 74 mL/min/{1.73_m2} (ref >59)
GFR calc non Af Amer: 64 mL/min/{1.73_m2} (ref >59)
Glucose: 88 mg/dL (ref 65–99)
Potassium: 5.4 mmol/L — ABNORMAL HIGH (ref 3.5–5.2)
Sodium: 143 mmol/L (ref 134–144)

## 2019-08-22 LAB — PRO B NATRIURETIC PEPTIDE: NT-Pro BNP: 973 pg/mL — ABNORMAL HIGH (ref 0–210)

## 2019-09-18 ENCOUNTER — Ambulatory Visit: Payer: BC Managed Care – PPO | Admitting: Cardiology

## 2019-10-02 ENCOUNTER — Ambulatory Visit: Payer: PRIVATE HEALTH INSURANCE | Admitting: Cardiology

## 2019-10-09 ENCOUNTER — Ambulatory Visit (HOSPITAL_COMMUNITY): Payer: Self-pay

## 2019-10-14 NOTE — Progress Notes (Signed)
Cardiology Office Note:    Date:  10/16/2019   ID:  Kenneth Pham, DOB [birth year], MRN [id number]  PCP:  Olive Bass, MD  Cardiologist:  Norman Herrlich, MD    Referring MD: Olive Bass, MD    ASSESSMENT:    1. Chronic systolic heart failure (HCC)   2. Hypertensive heart disease with heart failure (HCC)   3. CKD (chronic kidney disease) stage 2, GFR 60-89 ml/min    PLAN:    In order of problems listed above:  1. Stable compensated New [place] Heart Association class I continue a small dose of loop diuretic low-dose beta-blocker with his underlying lung disease and optimal dose of Entresto.  Recheck renal function potassium proBNP and refer for myocardial perfusion study for CAD screening 2. Stable BP at target on guideline directed therapy 3. Recheck renal function and potassium and avoid spironolactone with his top normal potassium   Next appointment: 3 months   Medication Adjustments/Labs and Tests Ordered: Current medicines are reviewed at length with the patient today.  Concerns regarding medicines are outlined above.  No orders of the defined types were placed in this encounter.  No orders of the defined types were placed in this encounter.   Chief Complaint  Patient presents with  . Follow-up  . Congestive Heart Failure    History of Present Illness:    Kenneth Pham is a 63 y.o. male with a hx of chronic systolic heart failure hypertensive heart disease with heart failure dilated cardiomyopathy and stage II CKD.  He was last seen 08/21/2019.  With anginal equivalent symptoms of exertional shortness of breath he was referred for cardiac CTA which is pending. Compliance with diet, lifestyle and medications: Yes  I reviewed his echocardiogram with patient  Echo 08/01/2019:  1. The left ventricle has moderate-severely reduced systolic function, with an ejection fraction of 30-35%. There is diffuse hypokinesis of the Left ventricle. There is mild concentric left  ventricular hypertrophy. Left ventricular diastolic Doppler  parameters are consistent with pseudonormalization. Elevated mean left atrial pressure.  2. There is abnormal global logitudinal strain (-7.5%).  3. The right ventricle has normal systolic function.  4. Left atrial and Right atrial size was normal.  5. Mild thickening of the aortic valve. Mild calcification of the aortic valve. Aortic valve regurgitation was not visualized by color flow Doppler.  He is pleased with the quality of his life and is not having shortness of breath edema orthopnea chest pain palpitation or syncope is compliant with his medications.  He declined a CTA because of financial toxicity he does not have health insurance and after discussion of options agrees to undergo myocardial perfusion study in our office.  If he had high risk ischemic markers he would benefit from revascularization.  We will continue his current medical treatment and not put him on spironolactone with his high normal potassium and with his financial restrictions I will not add SGLT2 inhibitor.  His heart failure is well compensated he takes correct medical therapy.  Past Medical History:  Diagnosis Date  . Acute CHF (HCC) 05/16/2019  . Alcohol dependence in remission (HCC) 05/16/2019  . Arteriosclerosis of carotid artery, right 05/15/2019  . Cerebellar stroke, acute (HCC) 05/09/2019  . Heart failure with reduced ejection fraction (HCC) 05/09/2019  . Hypercholesteremia   . Hypertension   . Pulmonary nodules 05/15/2019   05/01/2019 CT angio - 8.13mm bandlike nodular density lingula - 5.2mm R middle lobe - 61.5 pack year hx, rec 3 month  f/u    Past Surgical History:  Procedure Laterality Date  . APPENDECTOMY    . CATARACT EXTRACTION Right   . TONSILLECTOMY      Current Medications: Current Meds  Medication Sig  . aspirin EC 81 MG tablet Take 81 mg by mouth daily.   Marland Kitchen atorvastatin (LIPITOR) 80 MG tablet TAKE 1 TABLET (80 MG TOTAL) BY MOUTH  DAILY. CHOLESTEROL  . furosemide (LASIX) 20 MG tablet Take 1 tablet (20 mg total) by mouth 2 (two) times daily.  . metoprolol succinate (TOPROL-XL) 25 MG 24 hr tablet Take 1 tablet (25 mg total) by mouth daily.  . sacubitril-valsartan (ENTRESTO) 97-103 MG Take 1 tablet by mouth 2 (two) times daily.     Allergies:   Patient has no known allergies.   Social History   Socioeconomic History  . Marital status: Widowed    Spouse name: Not on file  . Number of children: Not on file  . Years of education: Not on file  . Highest education level: Not on file  Occupational History  . Not on file  Social Needs  . Financial resource strain: Not on file  . Food insecurity    Worry: Not on file    Inability: Not on file  . Transportation needs    Medical: Not on file    Non-medical: Not on file  Tobacco Use  . Smoking status: Former Smoker    Types: Cigarettes    Quit date: 04/29/2019    Years since quitting: 0.4  . Smokeless tobacco: Never Used  Substance and Sexual Activity  . Alcohol use: Not Currently  . Drug use: Not Currently    Types: Marijuana  . Sexual activity: Not on file  Lifestyle  . Physical activity    Days per week: Not on file    Minutes per session: Not on file  . Stress: Not on file  Relationships  . Social Musician on phone: Not on file    Gets together: Not on file    Attends religious service: Not on file    Active member of club or organization: Not on file    Attends meetings of clubs or organizations: Not on file    Relationship status: Not on file  Other Topics Concern  . Not on file  Social History Narrative  . Not on file     Family History: The patient's family history includes Cancer in his father; Heart Problems in his brother; Stroke in his mother. ROS:   Please see the history of present illness.    All other systems reviewed and are negative.  EKGs/Labs/Other Studies Reviewed:    The following studies were reviewed today:     Recent Labs: 05/29/2019: Hemoglobin 17.1; Platelets 354 07/10/2019: ALT 25 08/21/2019: BUN 28; Creatinine, Ser 1.20; NT-Pro BNP 973; Potassium 5.4; Sodium 143  Recent Lipid Panel    Component Value Date/Time   CHOL 155 07/10/2019 1411   TRIG 160 (H) 07/10/2019 1411   HDL 38 (L) 07/10/2019 1411   CHOLHDL 4.1 07/10/2019 1411   LDLCALC 85 07/10/2019 1411    Physical Exam:    VS:  BP 126/70 (BP Location: Left Arm, Patient Position: Sitting, Cuff Size: Normal)   Pulse 89   Ht 5\' 9"  (1.753 m)   Wt 182 lb 12.8 oz (82.9 kg)   SpO2 99%   BMI 26.99 kg/m     Wt Readings from Last 3 Encounters:  10/16/19 182 lb 12.8  oz (82.9 kg)  08/21/19 180 lb 9.6 oz (81.9 kg)  07/10/19 178 lb (80.7 kg)     GEN:  Well nourished, well developed in no acute distress HEENT: Normal NECK: No JVD; No carotid bruits LYMPHATICS: No lymphadenopathy CARDIAC: Soft S1 no S3 RRR, no murmurs, rubs, gallops RESPIRATORY:  Clear to auscultation without rales, wheezing or rhonchi  ABDOMEN: Soft, non-tender, non-distended MUSCULOSKELETAL:  No edema; No deformity  SKIN: Warm and dry NEUROLOGIC:  Alert and oriented x 3 PSYCHIATRIC:  Normal affect    Signed, Shirlee More, MD  10/16/2019 11:56 AM    [place]

## 2019-10-16 ENCOUNTER — Ambulatory Visit (INDEPENDENT_AMBULATORY_CARE_PROVIDER_SITE_OTHER): Payer: No Typology Code available for payment source | Admitting: Cardiology

## 2019-10-16 ENCOUNTER — Encounter: Payer: Self-pay | Admitting: *Deleted

## 2019-10-16 ENCOUNTER — Encounter: Payer: Self-pay | Admitting: Cardiology

## 2019-10-16 ENCOUNTER — Other Ambulatory Visit: Payer: Self-pay

## 2019-10-16 VITALS — BP 126/70 | HR 89 | Ht 69.0 in | Wt 182.8 lb

## 2019-10-16 DIAGNOSIS — R0602 Shortness of breath: Secondary | ICD-10-CM | POA: Diagnosis not present

## 2019-10-16 DIAGNOSIS — N182 Chronic kidney disease, stage 2 (mild): Secondary | ICD-10-CM

## 2019-10-16 DIAGNOSIS — I110 Hypertensive heart disease with heart failure: Secondary | ICD-10-CM

## 2019-10-16 DIAGNOSIS — I5022 Chronic systolic (congestive) heart failure: Secondary | ICD-10-CM | POA: Diagnosis not present

## 2019-10-16 NOTE — Patient Instructions (Signed)
Medication Instructions:  Your physician recommends that you continue on your current medications as directed. Please refer to the Current Medication list given to you today.  *If you need a refill on your cardiac medications before your next appointment, please call your pharmacy*  Lab Work: Your physician recommends that you return for lab work today: BMP, [place].   If you have labs (blood work) drawn today and your tests are completely normal, you will receive your results only by: Marland Kitchen MyChart Message (if you have MyChart) OR . A paper copy in the mail If you have any lab test that is abnormal or we need to change your treatment, we will call you to review the results.  Testing/Procedures:  Your physician has requested that you have a lexiscan myoview. For further information please visit [email or web address]. Please follow instruction sheet, as given.   Follow-Up: At [facility], you and your health needs are our priority.  As part of our continuing mission to provide you with exceptional heart care, we have created designated Provider Care Teams.  These Care Teams include your primary Cardiologist (physician) and Advanced Practice Providers (APPs -  Physician Assistants and Nurse Practitioners) who all work together to provide you with the care you need, when you need it.  Your next appointment:   3 month(s)  The format for your next appointment:   In Person  Provider:   Shirlee More, MD    Cardiac Nuclear Scan A cardiac nuclear scan is a test that is done to check the flow of blood to your heart. It is done when you are resting and when you are exercising. The test looks for problems such as:  Not enough blood reaching a portion of the heart.  The heart muscle not working as it should. You may need this test if:  You have heart disease.  You have had lab results that are not normal.  You have had heart surgery or a balloon procedure to open up blocked arteries  (angioplasty).  You have chest pain.  You have shortness of breath. In this test, a special dye (tracer) is put into your bloodstream. The tracer will travel to your heart. A camera will then take pictures of your heart to see how the tracer moves through your heart. This test is usually done at a hospital and takes 2-4 hours. Tell a doctor about:  Any allergies you have.  All medicines you are taking, including vitamins, herbs, eye drops, creams, and over-the-counter medicines.  Any problems you or family members have had with anesthetic medicines.  Any blood disorders you have.  Any surgeries you have had.  Any medical conditions you have.  Whether you are pregnant or may be pregnant. What are the risks? Generally, this is a safe test. However, problems may occur, such as:  Serious chest pain and heart attack. This is only a risk if the stress portion of the test is done.  Rapid heartbeat.  A feeling of warmth in your chest. This feeling usually does not last long.  Allergic reaction to the tracer. What happens before the test?  Ask your doctor about changing or stopping your normal medicines. This is important.  Follow instructions from your doctor about what you cannot eat or drink.  Remove your jewelry on the day of the test. What happens during the test?  An IV tube will be inserted into one of your veins.  Your doctor will give you a small amount of tracer through  the IV tube.  You will wait for 20-40 minutes while the tracer moves through your bloodstream.  Your heart will be monitored with an electrocardiogram (ECG).  You will lie down on an exam table.  Pictures of your heart will be taken for about 15-20 minutes.  You may also have a stress test. For this test, one of these things may be done: ? You will be asked to exercise on a treadmill or a stationary bike. ? You will be given medicines that will make your heart work harder. This is done if you are  unable to exercise.  When blood flow to your heart has peaked, a tracer will again be given through the IV tube.  After 20-40 minutes, you will get back on the exam table. More pictures will be taken of your heart.  Depending on the tracer that is used, more pictures may need to be taken 3-4 hours later.  Your IV tube will be removed when the test is over. The test may vary among doctors and hospitals. What happens after the test?  Ask your doctor: ? Whether you can return to your normal schedule, including diet, activities, and medicines. ? Whether you should drink more fluids. This will help to remove the tracer from your body. Drink enough fluid to keep your pee (urine) pale yellow.  Ask your doctor, or the department that is doing the test: ? When will my results be ready? ? How will I get my results? Summary  A cardiac nuclear scan is a test that is done to check the flow of blood to your heart.  Tell your doctor whether you are pregnant or may be pregnant.  Before the test, ask your doctor about changing or stopping your normal medicines. This is important.  Ask your doctor whether you can return to your normal activities. You may be asked to drink more fluids. This information is not intended to replace advice given to you by your health care provider. Make sure you discuss any questions you have with your health care provider. Document Released: 04/17/2018 Document Revised: 02/21/2019 Document Reviewed: 04/17/2018 Elsevier Patient Education  2020 [employer].

## 2019-10-16 NOTE — Addendum Note (Signed)
Addended by: Austin Miles on: 10/16/2019 12:13 PM   Modules accepted: Orders

## 2019-10-17 LAB — BASIC METABOLIC PANEL
BUN/Creatinine Ratio: 20 (ref 10–24)
BUN: 27 mg/dL (ref 8–27)
CO2: 24 mmol/L (ref 20–29)
Calcium: 9.8 mg/dL (ref 8.6–10.2)
Chloride: 102 mmol/L (ref 96–106)
Creatinine, Ser: 1.37 mg/dL — ABNORMAL HIGH (ref 0.76–1.27)
GFR calc Af Amer: 63 mL/min/{1.73_m2} (ref >59)
GFR calc non Af Amer: 55 mL/min/{1.73_m2} — ABNORMAL LOW (ref >59)
Glucose: 91 mg/dL (ref 65–99)
Potassium: 5.4 mmol/L — ABNORMAL HIGH (ref 3.5–5.2)
Sodium: 141 mmol/L (ref 134–144)

## 2019-10-17 LAB — PRO B NATRIURETIC PEPTIDE: NT-Pro BNP: 1155 pg/mL — ABNORMAL HIGH (ref 0–210)

## 2019-10-24 ENCOUNTER — Telehealth: Payer: Self-pay | Admitting: Cardiology

## 2019-10-24 NOTE — Telephone Encounter (Signed)
Please call patient to discuss medication options -- he is unable to afford the [facility]  Call him at [phone]

## 2019-10-25 ENCOUNTER — Telehealth: Payer: Self-pay | Admitting: Cardiology

## 2019-10-25 NOTE — Telephone Encounter (Signed)
Spoke with patient who reports that he went to pick up his prescription of entresto 97-103 mg tablets and it was going to cost him $530. Patient states he cannot afford that price. Advised patient to stop by our [place] office to pick up samples and a copay card as he has Pharmacist, community. Patient will also complete and sign the entresto patient assistance paperwork before leaving the office so we can fax back to the company for processing as soon as possible. Patient is agreeable to plan and verbalized understanding. No further questions.

## 2019-10-25 NOTE — Telephone Encounter (Signed)
Patient wants to know if we have samples of entresto.  He can not afford to get this med refilled.  Please call to discuss

## 2019-10-25 NOTE — Telephone Encounter (Signed)
Duplicate encounter. Please see most recent encounter.  

## 2019-11-12 ENCOUNTER — Other Ambulatory Visit: Payer: Self-pay | Admitting: Cardiology

## 2019-11-21 ENCOUNTER — Telehealth: Payer: Self-pay | Admitting: *Deleted

## 2019-11-21 NOTE — Telephone Encounter (Signed)
Left message on voicemail per DPR in reference to upcoming appointment scheduled on 11/28/2019 at 0800 with detailed instructions given per Myocardial Perfusion Study Information Sheet for the test. LM to arrive 15 minutes early, and that it is imperative to arrive on time for appointment to keep from having the test rescheduled. If you need to cancel or reschedule your appointment, please call the office within 24 hours of your appointment. Failure to do so may result in a cancellation of your appointment, and a $50 no show fee. Phone number given for call back for any questions. No mychart avaiable.Kenneth Pham, Kenneth Pham

## 2019-11-28 ENCOUNTER — Other Ambulatory Visit: Payer: Self-pay

## 2019-11-28 ENCOUNTER — Telehealth: Payer: Self-pay | Admitting: *Deleted

## 2019-11-28 ENCOUNTER — Ambulatory Visit (INDEPENDENT_AMBULATORY_CARE_PROVIDER_SITE_OTHER): Payer: 59

## 2019-11-28 VITALS — Ht 69.0 in | Wt 182.0 lb

## 2019-11-28 DIAGNOSIS — R0602 Shortness of breath: Secondary | ICD-10-CM | POA: Diagnosis not present

## 2019-11-28 DIAGNOSIS — R072 Precordial pain: Secondary | ICD-10-CM | POA: Diagnosis not present

## 2019-11-28 DIAGNOSIS — I5020 Unspecified systolic (congestive) heart failure: Secondary | ICD-10-CM | POA: Diagnosis not present

## 2019-11-28 DIAGNOSIS — I420 Dilated cardiomyopathy: Secondary | ICD-10-CM

## 2019-11-28 LAB — MYOCARDIAL PERFUSION IMAGING
LV dias vol: 143 mL (ref 62–150)
LV sys vol: 96 mL
Peak HR: 108 {beats}/min
Rest HR: 77 {beats}/min
SDS: 4
SRS: 5
SSS: 9
TID: 1.08

## 2019-11-28 MED ORDER — REGADENOSON 0.4 MG/5ML IV SOLN
0.4000 mg | Freq: Once | INTRAVENOUS | Status: AC
  Administered 2019-11-28: 0.4 mg via INTRAVENOUS

## 2019-11-28 MED ORDER — TECHNETIUM TC 99M TETROFOSMIN IV KIT
32.9000 | PACK | Freq: Once | INTRAVENOUS | Status: AC | PRN
  Administered 2019-11-28: 32.9 via INTRAVENOUS

## 2019-11-28 MED ORDER — TECHNETIUM TC 99M TETROFOSMIN IV KIT
10.2000 | PACK | Freq: Once | INTRAVENOUS | Status: AC | PRN
  Administered 2019-11-28: 10.2 via INTRAVENOUS

## 2019-11-28 NOTE — Telephone Encounter (Signed)
-----   Message from Baldo Daub, MD sent at 11/28/2019  1:14 PM EST ----- Normal or stable result  No ischemia, continue current meds and will review at office FU

## 2019-11-28 NOTE — Telephone Encounter (Signed)
Pt has been notified of myoview results by phone with verbal understanding. Pt wanted to let Dr. Mathis Bud know that he cannot afford the [facility]. I assured the pt that I will send my notes to Dr. Dulce Sellar as he may then opt to see if we can go through the assistance program or may change to another medication. Pt also wanted to let Dr. Dulce Sellar know that he is trying to file for disability. He states he Dr. Dulce Sellar may be getting a call in regards to pt filling for disability. I assured pt that Dr. Hulen Shouts nurse will call him back in regards to [facility] as to either a change to another medication or apply for through the asst prgm for  Entresto. The patient has been notified of the result and verbalized understanding.  All questions (if any) were answered. Danielle Rankin, [facility] 11/28/2019 3:11 PM

## 2019-11-30 MED ORDER — VALSARTAN 80 MG PO TABS: 80.0000 mg | ORAL_TABLET | Freq: Two times a day (BID) | ORAL | 3 refills | Status: DC

## 2019-11-30 NOTE — Telephone Encounter (Signed)
Patient is returning phone call.  °

## 2019-11-30 NOTE — Addendum Note (Signed)
Addended by: Virl Axe, Makyiah Lie L on: 11/30/2019 04:24 PM   Modules accepted: Orders

## 2019-11-30 NOTE — Telephone Encounter (Signed)
Left message for patient to return call.

## 2019-11-30 NOTE — Telephone Encounter (Signed)
Unfortunately there really is no one-to-one alternative Sherryll Burger is a much better drug but if he cannot afford it and if we have made every opportunity with assistance and we failed then I will transition to valsartan 80 mg twice daily

## 2019-11-30 NOTE — Telephone Encounter (Signed)
Patient stated he cannot afford entresto and he is trying to get disability. Patient stated maybe after his disability goes through maybe he can afford it. Patient stated he does not have any more entresto and he ran out of samples. Will send in Valsartan 80 mg BID as Dr. Dulce Sellar prescribed. Sent to patient's pharmacy of choice. Patient verbalized understanding.

## 2019-11-30 NOTE — Telephone Encounter (Signed)
Patient is calling to follow up on Entresto inquiry. Patient states he is unable to cover medication expenses and would like to know if he can opt for an alternative to the [facility] medication. Please advise.

## 2019-11-30 NOTE — Telephone Encounter (Signed)
Will forward to Dr. Dulce Sellar to advise on alternative for entresto.

## 2019-12-19 ENCOUNTER — Other Ambulatory Visit: Payer: Self-pay

## 2019-12-19 MED ORDER — SPIRONOLACTONE 25 MG PO TABS: 12.5000 mg | ORAL_TABLET | Freq: Every day | ORAL | 0 refills | Status: DC

## 2020-01-24 ENCOUNTER — Other Ambulatory Visit: Payer: Self-pay | Admitting: Cardiology

## 2020-02-03 NOTE — Progress Notes (Addendum)
Cardiology Office Note:    Date:  02/04/2020   ID:  Kenneth Pham, DOB 08/07/1956, MRN [id number]  PCP:  Algis Greenhouse, MD  Cardiologist:  Shirlee More, MD    Referring MD: Algis Greenhouse, MD    ASSESSMENT:    1. Chronic systolic heart failure ([name])   2. Hypertensive heart disease with heart failure ([name])   3. CKD (chronic kidney disease) stage 2, GFR 60-89 ml/min   4. Hypercholesteremia    PLAN:    In order of problems listed above:  1. Stable compensated New York Heart Association class II continue guideline directed therapy transitioning back to Entresto from valsartan and with his episode of syncope refer to EP for consideration of device and check 3-day ZIO monitor 2. Renal function potassium with CKD 3. Continue statin check liver function lipid profile  His ZIO monitor shows PVCs and 11 runs of nonsustained ventricular tachycardia. Next appointment: 3 months   Medication Adjustments/Labs and Tests Ordered: Current medicines are reviewed at length with the patient today.  Concerns regarding medicines are outlined above.  No orders of the defined types were placed in this encounter.  No orders of the defined types were placed in this encounter.   No chief complaint on file.   History of Present Illness:    Kenneth Pham is a 64 y.o. male with a hx of chronic systolic heart failure hypertensive heart disease with heart failure dilated cardiomyopathy and stage II CKD. EF 30-35% 08/01/2019.   last seen 10/16/2019. Compliance with diet, lifestyle and medications: Yes  I reviewed his biopsy results.  He still has severe ventricular dysfunction.  MPI 11/28/2019: Nuclear Stress Findings  Isotope administration Rest isotope was administered  with an IV injection of 10.2 mCi Tc51m Tetrofosmin.  Rest SPECT images were obtained approximately 45 minutes post tracer injection.  Stress isotope was administered  with an IV injection of 32.9 mCi Tc61m Tetrofosmin   Stress  SPECT images were obtained approximately 60 minutes post tracer injection.  Nuclear Measurements Study was gated.  Overall Study Impression Nuclear stress EF:  33%.  The left ventricular ejection fraction is moderately decreased (30-44%).     Ref Range & Units 3 mo ago 5 mo ago 6 mo ago 8 mo ago   NT-Pro BNP 0 - 210 pg/mL 1,155High   973High  CM  1,719High  CM  1,685High  CM    For several months has been taking valsartan in place of Entresto due to financial limitations.  Overall has New York Heart Association class II symptoms he short of breath climbing carrying or walking long distance no edema orthopnea palpitations or chest pain.  Several weeks he had an episode where he blacked out momentarily and fell.  With his severe LV dysfunction he wore a 3-day ZIO monitor and referred to EP for consideration of ICD therapy.  He has the financial resources and will transition back to Entresto from valsartan not on spironolactone because of a high potassium at baseline recheck labs today including lipids CMP and a proBNP level.  I do not feel he needs coronary angiography. Past Medical History:  Diagnosis Date  . Acute CHF ([place]) 05/16/2019  . Alcohol dependence in remission ([place]) 05/16/2019  . Arteriosclerosis of carotid artery, right 05/15/2019  . Cerebellar stroke, acute ([place]) 05/09/2019  . Chronic anticoagulation 07/13/2019  . CKD (chronic kidney disease) stage 2, GFR 60-89 ml/min 05/29/2019  . Dilated cardiomyopathy ([place]) 05/29/2019  . Heart failure with reduced ejection  fraction (H[facility]) 05/09/2019  . Hypercholesteremia   . Hypertension   . Hypertensive heart disease with heart failure ([facility]) 05/29/2019  . Precordial chest pain 08/21/2019  . Pulmonary nodules 05/15/2019   05/01/2019 CT angio - 8.87mm bandlike nodular density lingula - 5.77mm R middle lobe - 61.5 pack year hx, rec 3 month f/u  . Shortness of breath 08/21/2019  . Snores 05/29/2019    Past Surgical History:  Procedure Laterality Date  .  APPENDECTOMY    . CATARACT EXTRACTION Right   . TONSILLECTOMY      Current Medications: Current Meds  Medication Sig  . aspirin EC 81 MG tablet Take 81 mg by mouth daily.   Marland Kitchen atorvastatin (LIPITOR) 80 MG tablet TAKE 1 TABLET (80 MG TOTAL) BY MOUTH DAILY. CHOLESTEROL  . furosemide (LASIX) 20 MG tablet Take 1 tablet (20 mg total) by mouth 2 (two) times daily.  . metoprolol succinate (TOPROL-XL) 25 MG 24 hr tablet TAKE 1 TABLET BY MOUTH EVERY DAY  . valsartan (DIOVAN) 80 MG tablet Take 1 tablet (80 mg total) by mouth 2 (two) times daily.     Allergies:   Patient has no known allergies.   Social History   Socioeconomic History  . Marital status: Widowed    Spouse name: Not on file  . Number of children: Not on file  . Years of education: Not on file  . Highest education level: Not on file  Occupational History  . Not on file  Tobacco Use  . Smoking status: Former Smoker    Types: Cigarettes    Quit date: 04/29/2019    Years since quitting: 0.7  . Smokeless tobacco: Never Used  Substance and Sexual Activity  . Alcohol use: Not Currently  . Drug use: Not Currently    Types: Marijuana  . Sexual activity: Yes  Other Topics Concern  . Not on file  Social History Narrative  . Not on file   Social Determinants of Health   Financial Resource Strain:   . Difficulty of Paying Living Expenses:   Food Insecurity:   . Worried About Programme researcher, broadcasting/film/video in the Last Year:   . Barista in the Last Year:   Transportation Needs:   . Freight forwarder (Medical):   Marland Kitchen Lack of Transportation (Non-Medical):   Physical Activity:   . Days of Exercise per Week:   . Minutes of Exercise per Session:   Stress:   . Feeling of Stress :   Social Connections:   . Frequency of Communication with Friends and Family:   . Frequency of Social Gatherings with Friends and Family:   . Attends Religious Services:   . Active Member of Clubs or Organizations:   . Attends Banker  Meetings:   Marland Kitchen Marital Status:      Family History: The patient's family history includes Cancer in his father; Heart Problems in his brother; Stroke in his mother. ROS:   Please see the history of present illness.    All other systems reviewed and are negative.  EKGs/Labs/Other Studies Reviewed:    The following studies were reviewed today:    Recent Labs: 05/29/2019: Hemoglobin 17.1; Platelets 354 07/10/2019: ALT 25 10/16/2019: BUN 27; Creatinine, Ser 1.37; NT-Pro BNP 1,155; Potassium 5.4; Sodium 141  Recent Lipid Panel    Component Value Date/Time   CHOL 155 07/10/2019 1411   TRIG 160 (H) 07/10/2019 1411   HDL 38 (L) 07/10/2019 1411   CHOLHDL 4.1  07/10/2019 1411   LDLCALC 85 07/10/2019 1411    Physical Exam:    VS:  BP (!) 150/80 (BP Location: Right Arm, Patient Position: Sitting, Cuff Size: Normal)   Pulse 88   Temp (!) 97.5 F (36.4 C)   Ht 5\' 9"  (1.753 m)   Wt 188 lb 3.2 oz (85.4 kg)   SpO2 100%   BMI 27.79 kg/m     Wt Readings from Last 3 Encounters:  02/04/20 188 lb 3.2 oz (85.4 kg)  11/28/19 182 lb (82.6 kg)  10/16/19 182 lb 12.8 oz (82.9 kg)     GEN:  Well nourished, well developed in no acute distress HEENT: Normal NECK: No JVD; No carotid bruits LYMPHATICS: No lymphadenopathy CARDIAC: RRR, no murmurs, rubs, gallops RESPIRATORY:  Clear to auscultation without rales, wheezing or rhonchi  ABDOMEN: Soft, non-tender, non-distended MUSCULOSKELETAL:  No edema; No deformity  SKIN: Warm and dry NEUROLOGIC:  Alert and oriented x 3 PSYCHIATRIC:  Normal affect    Signed, 14/01/20, MD  02/04/2020 8:26 AM    [facility]

## 2020-02-04 ENCOUNTER — Encounter: Payer: Self-pay | Admitting: Cardiology

## 2020-02-04 ENCOUNTER — Ambulatory Visit (INDEPENDENT_AMBULATORY_CARE_PROVIDER_SITE_OTHER): Payer: Medicaid Other | Admitting: Cardiology

## 2020-02-04 ENCOUNTER — Ambulatory Visit (INDEPENDENT_AMBULATORY_CARE_PROVIDER_SITE_OTHER): Payer: Medicaid Other

## 2020-02-04 ENCOUNTER — Other Ambulatory Visit: Payer: Self-pay

## 2020-02-04 VITALS — BP 150/80 | HR 88 | Temp 97.5°F | Ht 69.0 in | Wt 188.2 lb

## 2020-02-04 DIAGNOSIS — N182 Chronic kidney disease, stage 2 (mild): Secondary | ICD-10-CM

## 2020-02-04 DIAGNOSIS — I5022 Chronic systolic (congestive) heart failure: Secondary | ICD-10-CM

## 2020-02-04 DIAGNOSIS — E7800 Pure hypercholesterolemia, unspecified: Secondary | ICD-10-CM | POA: Diagnosis not present

## 2020-02-04 DIAGNOSIS — R002 Palpitations: Secondary | ICD-10-CM

## 2020-02-04 DIAGNOSIS — I110 Hypertensive heart disease with heart failure: Secondary | ICD-10-CM | POA: Diagnosis not present

## 2020-02-04 LAB — LIPID PANEL
Chol/HDL Ratio: 2.9 ratio (ref 0.0–5.0)
Cholesterol, Total: 149 mg/dL (ref 100–199)
HDL: 52 mg/dL (ref >39)
LDL Chol Calc (NIH): 75 mg/dL (ref 0–99)
Triglycerides: 126 mg/dL (ref 0–149)
VLDL Cholesterol Cal: 22 mg/dL (ref 5–40)

## 2020-02-04 LAB — COMPREHENSIVE METABOLIC PANEL
ALT: 22 IU/L (ref 0–44)
AST: 22 IU/L (ref 0–40)
Albumin/Globulin Ratio: 2 (ref 1.2–2.2)
Albumin: 4.6 g/dL (ref 3.8–4.8)
Alkaline Phosphatase: 90 IU/L (ref 39–117)
BUN/Creatinine Ratio: 19 (ref 10–24)
BUN: 26 mg/dL (ref 8–27)
Bilirubin Total: 0.2 mg/dL (ref 0.0–1.2)
CO2: 24 mmol/L (ref 20–29)
Calcium: 9.5 mg/dL (ref 8.6–10.2)
Chloride: 102 mmol/L (ref 96–106)
Creatinine, Ser: 1.4 mg/dL — ABNORMAL HIGH (ref 0.76–1.27)
GFR calc Af Amer: 61 mL/min/{1.73_m2} (ref >59)
GFR calc non Af Amer: 53 mL/min/{1.73_m2} — ABNORMAL LOW (ref >59)
Globulin, Total: 2.3 g/dL (ref 1.5–4.5)
Glucose: 97 mg/dL (ref 65–99)
Potassium: 5.3 mmol/L — ABNORMAL HIGH (ref 3.5–5.2)
Sodium: 140 mmol/L (ref 134–144)
Total Protein: 6.9 g/dL (ref 6.0–8.5)

## 2020-02-04 LAB — PRO B NATRIURETIC PEPTIDE: NT-Pro BNP: 2016 pg/mL — ABNORMAL HIGH (ref 0–210)

## 2020-02-04 MED ORDER — SACUBITRIL-VALSARTAN 97-103 MG PO TABS: 1.0000 | ORAL_TABLET | Freq: Two times a day (BID) | ORAL | 2 refills | Status: DC

## 2020-02-04 MED ORDER — METOPROLOL SUCCINATE ER 25 MG PO TB24: 25.0000 mg | ORAL_TABLET | Freq: Every day | ORAL | 0 refills | Status: DC

## 2020-02-04 NOTE — Patient Instructions (Addendum)
Medication Instructions:  Your physician has recommended you make the following change in your medication:  1. STOP taking valsartan. 2. START: Entresto 97/103 mg. Take one tablet by mouth twice daily.   *If you need a refill on your cardiac medications before your next appointment, please call your pharmacy*   Lab Work: Your physician recommends that you return for lab work in: TODAY CMP, Lipids, ProBNP If you have labs (blood work) drawn today and your tests are completely normal, you will receive your results only by: Marland Kitchen MyChart Message (if you have MyChart) OR . A paper copy in the mail If you have any lab test that is abnormal or we need to change your treatment, we will call you to review the results.   Testing/Procedures: Your physician has recommended that you wear a Zio monitor. You will wear this monitor for 3 days. This monitor is a medical device that records the heart's electrical activity. Doctors most often use these monitors to diagnose arrhythmias. Arrhythmias are problems with the speed or rhythm of the heartbeat. The monitor is a small device applied to your chest. You can wear one while you do your normal daily activities. While wearing this monitor if you have any symptoms to push the button and record what you felt. Once you have worn this monitor for the period of time provider prescribed (Usually 14 days), you will return the monitor device in the postage paid box. Once it is returned they will download the data collected and provide [place] with a report which the provider will then review and we will call you with those results. Important tips:  1. Avoid showering during the first 24 hours of wearing the monitor. 2. Avoid excessive sweating to help maximize wear time. 3. Do not submerge the device, no hot tubs, and no swimming pools. 4. Keep any lotions or oils away from the patch. 5. After 24 hours you may shower with the patch on. Take brief showers with your back facing  the shower head.  6. Do not remove patch once it has been placed because that will interrupt data and decrease adhesive wear time. 7. Push the button when you have any symptoms and write down what you were feeling. 8. Once you have completed wearing your monitor, remove and place into box which has postage paid and place in your outgoing mailbox.  9. If for some reason you have misplaced your box then call our office and we can provide another box and/or mail it off for you.         Follow-Up: At [facility], you and your health needs are our priority.  As part of our continuing mission to provide you with exceptional heart care, we have created designated Provider Care Teams.  These Care Teams include your primary Cardiologist (physician) and Advanced Practice Providers (APPs -  Physician Assistants and Nurse Practitioners) who all work together to provide you with the care you need, when you need it.  We recommend signing up for the patient portal called "MyChart".  Sign up information is provided on this After Visit Summary.  MyChart is used to connect with patients for Virtual Visits (Telemedicine).  Patients are able to view lab/test results, encounter notes, upcoming appointments, etc.  Non-urgent messages can be sent to your provider as well.   To learn more about what you can do with MyChart, go to [email or web address].    We have put in a referral for you to see the electrophysiology  doctor. You will get a call to schedule this appointment.   Your next appointment:   3 month(s)  The format for your next appointment:   In Person  Provider:   Shirlee More, MD   Other Instructions Heart Failure  Weigh yourself every morning when you first wake up and record on a calender or note pad, bring this to your office visits. Using a pill tender can help with taking your medications consistently.  Limit your fluid intake to 2 liters daily  Limit your sodium intake to less  than 2-3 grams daily. Ask if you need dietary teaching.  If you gain more than 3 pounds (from your dry weight ), double your dose of diuretic for the day.  If you gain more than 5 pounds (from your dry weight), double your dose of lasix and call your heart failure doctor.  Please do not smoke tobacco since it is very bad for your heart.  Please do not drink alcohol since it can worsen your heart failure.Also avoid OTC nonsteroidal drugs, such as advil, aleve and motrin.  Try to exercise for at least 30 minutes every day because this will help your heart be more efficient. You may be eligible for supervised cardiac rehab, ask your physician.

## 2020-02-05 ENCOUNTER — Telehealth: Payer: Self-pay | Admitting: Cardiology

## 2020-02-05 ENCOUNTER — Telehealth: Payer: Self-pay

## 2020-02-05 NOTE — Telephone Encounter (Signed)
Prior authorization for Sherryll Burger was denied. I called [employer] and put in an appeal to the authorization. I was told that I would be notified via fax of the outcome of this appeal.

## 2020-02-05 NOTE — Telephone Encounter (Signed)
Left message to call back  

## 2020-02-05 NOTE — Telephone Encounter (Signed)
Patient returned call for lab results.  

## 2020-02-07 NOTE — Telephone Encounter (Signed)
The patient has been notified of the lab result and verbalized understanding.  All questions (if any) were answered. Sigurd Sos, RN 02/07/2020 8:33 AM

## 2020-02-20 ENCOUNTER — Telehealth: Payer: Self-pay

## 2020-02-20 NOTE — Telephone Encounter (Signed)
-----   Message from Brian J Munley, MD sent at 02/19/2020  5:10 PM EDT ----- Normal or stable result  He is having brief runs of PVCs and should keep his appointment to see Dr. Camnitz scheduled in May. 

## 2020-02-20 NOTE — Telephone Encounter (Signed)
LMTCB regarding monitor results 

## 2020-02-21 ENCOUNTER — Telehealth: Payer: Self-pay

## 2020-02-21 NOTE — Telephone Encounter (Signed)
-----   Message from Baldo Daub, MD sent at 02/19/2020  5:10 PM EDT ----- Normal or stable result  He is having brief runs of PVCs and should keep his appointment to see Dr. Elberta Fortis scheduled in May.

## 2020-02-21 NOTE — Telephone Encounter (Signed)
Spoke with patient regarding results and recommendation.  Patient verbalizes understanding and is agreeable to plan of care. Advised patient to call back with any issues or concerns.  

## 2020-03-07 ENCOUNTER — Other Ambulatory Visit: Payer: Self-pay | Admitting: Cardiology

## 2020-03-07 DIAGNOSIS — I420 Dilated cardiomyopathy: Secondary | ICD-10-CM

## 2020-03-07 DIAGNOSIS — I110 Hypertensive heart disease with heart failure: Secondary | ICD-10-CM

## 2020-03-07 DIAGNOSIS — I5022 Chronic systolic (congestive) heart failure: Secondary | ICD-10-CM

## 2020-04-07 ENCOUNTER — Ambulatory Visit (INDEPENDENT_AMBULATORY_CARE_PROVIDER_SITE_OTHER): Payer: 59 | Admitting: Cardiology

## 2020-04-07 ENCOUNTER — Other Ambulatory Visit: Payer: Self-pay

## 2020-04-07 ENCOUNTER — Encounter: Payer: Self-pay | Admitting: Cardiology

## 2020-04-07 VITALS — BP 140/81 | HR 76 | Ht 69.0 in | Wt 185.0 lb

## 2020-04-07 DIAGNOSIS — I5022 Chronic systolic (congestive) heart failure: Secondary | ICD-10-CM

## 2020-04-07 DIAGNOSIS — Z01812 Encounter for preprocedural laboratory examination: Secondary | ICD-10-CM

## 2020-04-07 NOTE — Progress Notes (Signed)
Electrophysiology Office Note   Date:  04/07/2020   ID:  Kenneth Pham, DOB Feb 08, 1956, MRN [id number]  PCP:  Olive Bass, MD  Cardiologist:  Dulce Sellar  Primary Electrophysiologist:  Will [name], MD    Chief Complaint: CHF, PVCs   History of Present Illness: Kenneth Pham is a 64 y.o. male who is being seen today for the evaluation of CHF at the request of Dulce Sellar, Iline Oven, MD. Presenting today for electrophysiology evaluation.  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   He has NYHA class II symptoms.  He gets short of breath when walking long distances.  He did have an episode where he blacked out and fell.  He wore a 3-day ZIO monitor and was referred to EP for ICD consideration.  Today, he denies symptoms of palpitations, chest pain,  orthopnea, PND, lower extremity edema, claudication, dizziness, presyncope, syncope, bleeding, or neurologic sequela. The patient is tolerating medications without difficulties.  His main symptom associated with his heart failure is shortness of breath.  He is able to do most of his daily activities, but does get short of breath when he overexerts himself.  He was fishing with his grandson this past weekend and got very short of breath walking up a hill.  Aside from that he has no major complaints.  Past Medical History:  Diagnosis Date  . Acute CHF (HCC) 05/16/2019  . Alcohol dependence in remission (HCC) 05/16/2019  . Arteriosclerosis of carotid artery, right 05/15/2019  . Cerebellar stroke, acute (H[facility]) 05/09/2019  . Chronic anticoagulation 07/13/2019  . CKD (chronic kidney disease) stage 2, GFR 60-89 ml/min 05/29/2019  . Dilated cardiomyopathy (HCC) 05/29/2019  . Heart failure with reduced ejection fraction (HCC) 05/09/2019  . Hypercholesteremia   . Hypertension   . Hypertensive heart disease with heart failure (H[facility])  05/29/2019  . Precordial chest pain 08/21/2019  . Pulmonary nodules 05/15/2019   05/01/2019 CT angio - 8.105mm bandlike nodular density lingula - 5.46mm R middle lobe - 61.5 pack year hx, rec 3 month f/u  . Shortness of breath 08/21/2019  . Snores 05/29/2019   Past Surgical History:  Procedure Laterality Date  . APPENDECTOMY    . CATARACT EXTRACTION Right   . TONSILLECTOMY       Current Outpatient Medications  Medication Sig Dispense Refill  . aspirin EC 81 MG tablet Take 81 mg by mouth daily.     Marland Kitchen atorvastatin (LIPITOR) 80 MG tablet TAKE 1 TABLET (80 MG TOTAL) BY MOUTH DAILY. CHOLESTEROL 90 tablet 0  . furosemide (LASIX) 20 MG tablet TAKE 1 TABLET BY MOUTH TWICE A DAY 180 tablet 1  . metoprolol succinate (TOPROL-XL) 25 MG 24 hr tablet Take 1 tablet (25 mg total) by mouth daily. 90 tablet 0  . sacubitril-valsartan (ENTRESTO) 97-103 MG Take 1 tablet by mouth 2 (two) times daily. 60 tablet 2   No current facility-administered medications for this visit.    Allergies:   Patient has no known allergies.   Social History:  The patient  reports that he quit smoking about 11 months ago. His smoking use included cigarettes. He has never used smokeless tobacco. He reports previous alcohol use. He reports previous drug use. Drug: Marijuana.   Family History:  The patient's family history includes Cancer in his father; Heart Problems in his brother; Stroke in his mother.    ROS:  Please see  the history of present illness.   Otherwise, review of systems is positive for none.   All other systems are reviewed and negative.    PHYSICAL EXAM: VS:  BP 140/81   Pulse 76   Ht 5\' 9"  (1.753 m)   Wt 185 lb (83.9 kg)   SpO2 98%   BMI 27.32 kg/m  , BMI Body mass index is 27.32 kg/m. GEN: Well nourished, well developed, in no acute distress  HEENT: normal  Neck: no JVD, carotid bruits, or masses Cardiac: RRR; no murmurs, rubs, or gallops,no edema  Respiratory:  clear to auscultation bilaterally,  normal work of breathing GI: soft, nontender, nondistended, + BS MS: no deformity or atrophy  Skin: warm and dry Neuro:  Strength and sensation are intact Psych: euthymic mood, full affect  EKG:  EKG is ordered today. Personal review of the ekg ordered shows sinus rhythm, PVCs, rate 76  Recent Labs: 05/29/2019: Hemoglobin 17.1; Platelets 354 02/04/2020: ALT 22; BUN 26; Creatinine, Ser 1.40; NT-Pro BNP 2,016; Potassium 5.3; Sodium 140    Lipid Panel     Component Value Date/Time   CHOL 149 02/04/2020 0911   TRIG 126 02/04/2020 0911   HDL 52 02/04/2020 0911   CHOLHDL 2.9 02/04/2020 0911   LDLCALC 75 02/04/2020 0911     Wt Readings from Last 3 Encounters:  04/07/20 185 lb (83.9 kg)  02/04/20 188 lb 3.2 oz (85.4 kg)  11/28/19 182 lb (82.6 kg)      Other studies Reviewed: Additional studies/ records that were reviewed today include: TTE 08/02/19  Review of the above records today demonstrates:  1. The left ventricle has moderate-severely reduced systolic function,  with an ejection fraction of 30-35%. There is diffuse hypokinesis of the  Left ventricle. There is mild concentric left ventricular hypertrophy.  Left ventricular diastolic Doppler  parameters are consistent with pseudonormalization. Elevated mean left  atrial pressure.  2. There is abnormal global logitudinal strain (-7.5%).  3. The right ventricle has normal systolic function.  4. Left atrial and Right atrial size was normal.  5. Mild thickening of the aortic valve. Mild calcification of the aortic  valve. Aortic valve regurgitation was not visualized by color flow  Doppler.   Monitor 02/19/20 personally  Reviewed The rhythm throughout was sinus with minimum average and maximum heart rates of 51, 81 and 128 bpm. Ventricular ectopy was occasional PVCs and rare couplets and triplets.  There were 11 runs of PVCs the fastest 12 complexes at a rate of 163 bpm and the longest 16.2 seconds at a relatively slow rate  of 113 bpm.    ASSESSMENT AND PLAN:  1.  Chronic systolic heart failure due to dilated cardiomyopathy: NYHA class II.  Currently on Entresto and Toprol-XL.  He does have some weakness and fatigue as well as shortness of breath when he exerts himself.  He would thus benefit from ICD implant.  Risks and benefits were discussed.  Risks include bleeding, infection, tamponade, pneumothorax.  He understands these risks and is agreed to the procedure.  His blood pressure is mildly elevated today.  We will increase his Toprol-XL to 50 mg.  2.  Hypertension: Elevated today.  Plan to increase Toprol-XL  3.  Hyperlipidemia: Continue statin per primary cardiology  Case discussed with referring cardiologist  Current medicines are reviewed at length with the patient today.   The patient does not have concerns regarding his medicines.  The following changes were made today:  none  Labs/ tests  ordered today include:  Orders Placed This Encounter  Procedures  . Basic metabolic panel  . CBC  . EKG 12-Lead     Disposition:   FU with Will Camnitz 3 months  Signed, Will [name], MD  04/07/2020 11:04 AM     [facility] [address] [place] [address] [phone] (office) [phone] (fax)

## 2020-04-07 NOTE — Patient Instructions (Signed)
Medication Instructions:  Your physician recommends that you continue on your current medications as directed. Please refer to the Current Medication list given to you today.  *If you need a refill on your cardiac medications before your next appointment, please call your pharmacy*   Lab Work: Your physician recommends that you return for lab work between 6/21 - 7/09 for BMET & CBC  If you have labs (blood work) drawn today and your tests are completely normal, you will receive your results only by: Marland Kitchen MyChart Message (if you have MyChart) OR . A paper copy in the mail If you have any lab test that is abnormal or we need to change your treatment, we will call you to review the results.   Testing/Procedures: Your physician has recommended that you have a defibrillator inserted. An implantable cardioverter defibrillator (ICD) is a small device that is placed in your chest or, in rare cases, your abdomen. This device uses electrical pulses or shocks to help control life-threatening, irregular heartbeats that could lead the heart to suddenly stop beating (sudden cardiac arrest). Leads are attached to the ICD that goes into your heart. This is done in the hospital and usually requires an overnight stay. Please see the instruction below located under "other instructions".    Follow-Up: Your physician recommends that you schedule a follow-up appointment in: 10-14 days, after your procedure on 05/28/20, with device clinic for a wound check.  At [facility], you and your health needs are our priority.  As part of our continuing mission to provide you with exceptional heart care, we have created designated Provider Care Teams.  These Care Teams include your primary Cardiologist (physician) and Advanced Practice Providers (APPs -  Physician Assistants and Nurse Practitioners) who all work together to provide you with the care you need, when you need it.  We recommend signing up for the patient portal  called "MyChart".  Sign up information is provided on this After Visit Summary.  MyChart is used to connect with patients for Virtual Visits (Telemedicine).  Patients are able to view lab/test results, encounter notes, upcoming appointments, etc.  Non-urgent messages can be sent to your provider as well.   To learn more about what you can do with MyChart, go to [email or web address].    Your next appointment:   3 month(s), after your procedure  The format for your next appointment:   In Person  Provider:   Allegra Lai, MD   Thank you for choosing [facility]!!   Trinidad Curet, RN [phone]    Other Instructions    Implantable Device Instructions  You are scheduled for: Implantable cardiac defibrillator on 05/28/20 with Dr. Curt Bears.  1.   Pre procedure testing-             A.  LAB WORK--- between 6/21 - 7/09 for pre procedure blood work.  You do NOT need to be fasting.              B. COVID TEST-- On 05/26/20 @ 11:30 am - You will go to [facility] hospital ([employer]) for your Covid testing.   This is a drive thru test site.  There will be multiple testing areas.  Be sure to share with the first checkpoint that you are there for pre-procedure/surgery testing. This will put you into the right (yellow) lane that leads to the PAT testing team.   Stay in your car and the nurse team will come to your car to  test you.  After you are tested please go home and self quarantine until the day of your procedure.    2. On the day of your procedure 05/28/20 you will go to [facility] hospital ([address]) at 9:30 am.  Bonita Quin will go to the main entrance [employer]) and enter where the [employer] are.  You will check in at ADMITTING.  You may have one support person come in to the hospital with you.  They will be asked to wait in the waiting room.   3.   Do not eat or drink after midnight prior to your procedure.   4.   On the morning of your procedure  do NOT take any medication.  5.  The night before your procedure and the morning of your procedure scrub your neck/chest with surgical scrub.  An instruction letter is included below.   5.  Plan for an overnight stay.  If you use your phone frequently bring your phone charger.  When you are discharged you will need someone to drive you home.   6.  You will follow up with the [facility] Device clinic 10-14 days after your procedure. You will follow up with Dr. Elberta Fortis 91 days after your procedure.  These appointments will be made for you.   * If you have ANY questions after you get home, please call the office [phone] and ask for Gorman Safi RN or send a MyChart message.    [place] - Preparing For Surgery (surgical scrub)  Before surgery, you can play an important role. Because skin is not sterile, your skin needs to be as free of germs as possible. You can reduce the number of germs on your skin by washing with CHG (chlorahexidine gluconate) Soap before surgery.  CHG is an antiseptic cleaner which kills germs and bonds with the skin to continue killing germs even after washing.   Please do not use if you have an allergy to CHG or antibacterial soaps.  If your skin becomes reddened/irritated stop using the CHG.   Do not shave (including legs and underarms) for at least 48 hours prior to first CHG shower.  It is OK to shave your face.  Please follow these instructions carefully:  1.  Shower the night before surgery and the morning of surgery with CHG.  2.  If you choose to wash your hair, wash your hair first as usual with your normal shampoo.  3.  After you shampoo, rinse your hair and body thoroughly to remove the shampoo.  4.  Use CHG as you would any other liquid soap.  You can apply CHG directly to the skin and wash gently with a clean washcloth. 5.  Apply the CHG Soap to your body ONLY FROM THE NECK DOWN.  Do not use on open wounds or open sores.  Avoid contact with your eyes, ears,  mouth and genitals (private parts).  Wash genitals (private parts) with your normal soap.  6.  Wash thoroughly, paying special attention to the area where your surgery will be performed.  7.  Thoroughly rinse your body with warm water from the neck down.   8.  DO NOT shower/wash with your normal soap after using and rinsing off the CHG soap.  9.  Pat yourself dry with a clean towel.           10.  Wear clean pajamas.  11.  Place clean sheets on your bed the night of your first shower and do not sleep with pets.  Day of Surgery: Do not apply any deodorants/lotions.  Please wear clean clothes to the hospital/surgery center.    Cardioverter Defibrillator Implantation  An implantable cardioverter defibrillator (ICD) is a small device that is placed under the skin in the chest or abdomen. An ICD consists of a battery, a small computer (pulse generator), and wires (leads) that go into the heart. An ICD is used to detect and correct two types of dangerous irregular heartbeats (arrhythmias):  A rapid heart rhythm (tachycardia).  An arrhythmia in which the lower chambers of the heart (ventricles) contract in an uncoordinated way (fibrillation). When an ICD detects tachycardia, it sends a low-energy shock to the heart to restore the heartbeat to normal (cardioversion). This signal is usually painless. If cardioversion does not work or if the ICD detects fibrillation, it delivers a high-energy shock to the heart (defibrillation) to restart the heart. This shock may feel like a strong jolt in the chest. Your health care provider may prescribe an ICD if:  You have had an arrhythmia that originated in the ventricles.  Your heart has been damaged by a disease or heart condition. Sometimes, ICDs are programmed to act as a device called a pacemaker. Pacemakers can be used to treat a slow heartbeat (bradycardia) or tachycardia by taking over the heart rate with electrical impulses. Tell a health  care provider about:  Any allergies you have.  All medicines you are taking, including vitamins, herbs, eye drops, creams, and over-the-counter medicines.  Any problems you or family members have had with anesthetic medicines.  Any blood disorders you have.  Any surgeries you have had.  Any medical conditions you have.  Whether you are pregnant or may be pregnant. What are the risks? Generally, this is a safe procedure. However, problems may occur, including:  Swelling, bleeding, or bruising.  Infection.  Blood clots.  Damage to other structures or organs, such as nerves, blood vessels, or the heart.  Allergic reactions to medicines used during the procedure. What happens before the procedure? Staying hydrated Follow instructions from your health care provider about hydration, which may include:  Up to 2 hours before the procedure - you may continue to drink clear liquids, such as water, clear fruit juice, black coffee, and plain tea. Eating and drinking restrictions Follow instructions from your health care provider about eating and drinking, which may include:  8 hours before the procedure - stop eating heavy meals or foods such as meat, fried foods, or fatty foods.  6 hours before the procedure - stop eating light meals or foods, such as toast or cereal.  6 hours before the procedure - stop drinking milk or drinks that contain milk.  2 hours before the procedure - stop drinking clear liquids. Medicine Ask your health care provider about:  Changing or stopping your normal medicines. This is important if you take diabetes medicines or blood thinners.  Taking medicines such as aspirin and ibuprofen. These medicines can thin your blood. Do not take these medicines before your procedure if your doctor tells you not to. Tests  You may have blood tests.  You may have a test to check the electrical signals in your heart (electrocardiogram, ECG).  You may have imaging  tests, such as a chest X-ray. General instructions  For 24 hours before the procedure, stop using products that contain nicotine or tobacco, such as  cigarettes and e-cigarettes. If you need help quitting, ask your health care provider.  Plan to have someone take you home from the hospital or clinic.  You may be asked to shower with a germ-killing soap. What happens during the procedure?  To reduce your risk of infection: ? Your health care team will wash or sanitize their hands. ? Your skin will be washed with soap. ? Hair may be removed from the surgical area.  Small monitors will be put on your body. They will be used to check your heart, blood pressure, and oxygen level.  An IV tube will be inserted into one of your veins.  You will be given one or more of the following: ? A medicine to help you relax (sedative). ? A medicine to numb the area (local anesthetic). ? A medicine to make you fall asleep (general anesthetic).  Leads will be guided through a blood vessel into your heart and attached to your heart muscles. Depending on the ICD, the leads may go into one ventricle or they may go into both ventricles and into an upper chamber of the heart. An X-ray machine (fluoroscope) will be usedto help guide the leads.  A small incision will be made to create a deep pocket under your skin.  The pulse generator will be placed into the pocket.  The ICD will be tested.  The incision will be closed with stitches (sutures), skin glue, or staples.  A bandage (dressing) will be placed over the incision. This procedure may vary among health care providers and hospitals. What happens after the procedure?  Your blood pressure, heart rate, breathing rate, and blood oxygen level will be monitored often until the medicines you were given have worn off.  A chest X-ray will be taken to check that the ICD is in the right place.  You will need to stay in the hospital for 1-2 days so your health  care provider can make sure your ICD is working.  Do not drive for 24 hours if you received a sedative. Ask your health care provider when it is safe for you to drive.  You may be given an identification card explaining that you have an ICD. Summary  An implantable cardioverter defibrillator (ICD) is a small device that is placed under the skin in the chest or abdomen. It is used to detect and correct dangerous irregular heartbeats (arrhythmias).  An ICD consists of a battery, a small computer (pulse generator), and wires (leads) that go into the heart.  When an ICD detects rapid heart rhythm (tachycardia), it sends a low-energy shock to the heart to restore the heartbeat to normal (cardioversion). If cardioversion does not work or if the ICD detects uncoordinated heart contractions (fibrillation), it delivers a high-energy shock to the heart (defibrillation) to restart the heart.  You will need to stay in the hospital for 1-2 days to make sure your ICD is working. This information is not intended to replace advice given to you by your health care provider. Make sure you discuss any questions you have with your health care provider. Document Revised: 10/14/2017 Document Reviewed: 11/10/2016 Elsevier Patient Education  2020 [employer].       Supplemental Discharge Instructions for  Pacemaker/Defibrillator Patients  Activity No heavy lifting or vigorous activity with your left/right arm for 6 to 8 weeks.  Do not raise your left/right arm above your head for one week.  Gradually raise your affected arm as drawn below.  __  NO DRIVING for     ; you may begin driving on     .  WOUND CARE - Keep the wound area clean and dry.  Do not get this area wet for one week. No showers for one week; you may shower on     . - The tape/steri-strips on your wound will fall off; do not pull them off.  No bandage is needed on the site.  DO  NOT apply any creams, oils, or ointments to the  wound area. - If you notice any drainage or discharge from the wound, any swelling or bruising at the site, or you develop a fever > 101? F after you are discharged home, call the office at once.  Special Instructions - You are still able to use cellular telephones; use the ear opposite the side where you have your pacemaker/defibrillator.  Avoid carrying your cellular phone near your device. - When traveling through airports, show security personnel your identification card to avoid being screened in the metal detectors.  Ask the security personnel to use the hand wand. - Avoid arc welding equipment, MRI testing (magnetic resonance imaging), TENS units (transcutaneous nerve stimulators).  Call the office for questions about other devices. - Avoid electrical appliances that are in poor condition or are not properly grounded. - Microwave ovens are safe to be near or to operate.  Additional information for defibrillator patients should your device go off: - If your device goes off ONCE and you feel fine afterward, notify the device clinic nurses. - If your device goes off ONCE and you do not feel well afterward, call 911. - If your device goes off TWICE, call 911. - If your device goes off THREE times in one day, call 911.  DO NOT DRIVE YOURSELF OR A FAMILY MEMBER WITH A DEFIBRILLATOR TO THE HOSPITAL--CALL 911.

## 2020-04-17 ENCOUNTER — Other Ambulatory Visit: Payer: Self-pay | Admitting: Cardiology

## 2020-05-05 NOTE — Progress Notes (Signed)
Cardiology Office Note:    Date:  05/06/2020   ID:  Kenneth Pham, DOB 1956/04/07, MRN [id number]  PCP:  Algis Greenhouse, MD  Cardiologist:  Shirlee More, MD    Referring MD: Algis Greenhouse, MD    ASSESSMENT:    1. Chronic systolic (congestive) heart failure ([place])   2. Hypertensive heart disease with heart failure ([place])   3. CKD (chronic kidney disease) stage 2, GFR 60-89 ml/min    PLAN:    In order of problems listed above:  1. Stable New York Heart Association class I rates we will continue guideline directed therapy including a minimum dose of loop diuretic high-dose Entresto and increased dose of metoprolol XL that he tolerates well despite his underlying lung disease he is not on MRA with his CKD and top normal potassium. 2. Cardiomyopathy syncope ventricular ectopy he will have his ICD inserted 3. Hypertension CKD stable schedule for labs to be performed next week.  If potassium is elevated I would put him on a resin binder Lokelma to continue guideline therapy   Next appointment: I will see him in 6 months as he will be having EP visits in the interim   Medication Adjustments/Labs and Tests Ordered: Current medicines are reviewed at length with the patient today.  Concerns regarding medicines are outlined above.  No orders of the defined types were placed in this encounter.  No orders of the defined types were placed in this encounter.   Chief Complaint  Patient presents with  . Follow-up    With reduced ejection fraction and nonsustained ventricular tachycardia  . Congestive Heart Failure    History of Present Illness:    Kenneth Pham is a 64 y.o. male with a hx of chronic systolic heart failure hypertensive heart disease with heart failure dilated cardiomyopathy and stage II CKD. EF 30-35%  last seen 02/04/2020.  At that visit he was transition to [facility] and wore a 3-day monitor because of an episode of syncope.  Occasional PVCs were couplets and triplets and  brief runs of ventricular premature contractions, nonsustained ventricular tachycardia.  He had been referred to EP for consideration of device therapy and was seen 04/07/2020 with plans for ICD implantation Compliance with diet, lifestyle and medications: Yes  He has a few things on his mind the first is inquires about risk for cataract surgery and I told him at such a low risk procedure I think he can have a performed with very low risk and he will be contacting ophthalmology.  The second is a still has not had COVID-19 vaccine asked me my opinion I strongly encouraged him to do it at the first opportunity.  His last visit he states is scheduled for an ICD had some questions and after discussion of benefits and risk he has no hesitancy and will go on and have his ICD inserted next month.  He will be scheduled for labs to check renal function and potassium prior to that surgery.  Overall he is doing well no edema shortness of breath chest pain palpitation or syncope.  Compliant with medications weighs daily. Past Medical History:  Diagnosis Date  . Acute CHF ([place]) 05/16/2019  . Alcohol dependence in remission ([place]) 05/16/2019  . Arteriosclerosis of carotid artery, right 05/15/2019  . Cerebellar stroke, acute ([place]) 05/09/2019  . Chronic anticoagulation 07/13/2019  . CKD (chronic kidney disease) stage 2, GFR 60-89 ml/min 05/29/2019  . Dilated cardiomyopathy ([place]) 05/29/2019  . Heart failure with reduced ejection fraction (  HCC) 05/09/2019  . Hypercholesteremia   . Hypertension   . Hypertensive heart disease with heart failure (H[facility]) 05/29/2019  . Precordial chest pain 08/21/2019  . Pulmonary nodules 05/15/2019   05/01/2019 CT angio - 8.33mm bandlike nodular density lingula - 5.44mm R middle lobe - 61.5 pack year hx, rec 3 month f/u  . Shortness of breath 08/21/2019  . Snores 05/29/2019    Past Surgical History:  Procedure Laterality Date  . APPENDECTOMY    . CATARACT EXTRACTION Right   . TONSILLECTOMY       Current Medications: Current Meds  Medication Sig  . aspirin EC 81 MG tablet Take 81 mg by mouth daily.   Marland Kitchen atorvastatin (LIPITOR) 80 MG tablet TAKE 1 TABLET (80 MG TOTAL) BY MOUTH DAILY. CHOLESTEROL  . furosemide (LASIX) 20 MG tablet TAKE 1 TABLET BY MOUTH TWICE A DAY  . metoprolol succinate (TOPROL-XL) 25 MG 24 hr tablet Take 1 tablet (25 mg total) by mouth daily. (Patient taking differently: Take 25 mg by mouth 2 (two) times daily. )  . sacubitril-valsartan (ENTRESTO) 97-103 MG Take 1 tablet by mouth 2 (two) times daily.     Allergies:   Patient has no known allergies.   Social History   Socioeconomic History  . Marital status: Widowed    Spouse name: Not on file  . Number of children: Not on file  . Years of education: Not on file  . Highest education level: Not on file  Occupational History  . Not on file  Tobacco Use  . Smoking status: Former Smoker    Types: Cigarettes    Quit date: 04/29/2019    Years since quitting: 1.0  . Smokeless tobacco: Never Used  Vaping Use  . Vaping Use: Never used  Substance and Sexual Activity  . Alcohol use: Not Currently  . Drug use: Not Currently    Types: Marijuana  . Sexual activity: Yes  Other Topics Concern  . Not on file  Social History Narrative  . Not on file   Social Determinants of Health   Financial Resource Strain:   . Difficulty of Paying Living Expenses:   Food Insecurity:   . Worried About Programme researcher, broadcasting/film/video in the Last Year:   . Barista in the Last Year:   Transportation Needs:   . Freight forwarder (Medical):   Marland Kitchen Lack of Transportation (Non-Medical):   Physical Activity:   . Days of Exercise per Week:   . Minutes of Exercise per Session:   Stress:   . Feeling of Stress :   Social Connections:   . Frequency of Communication with Friends and Family:   . Frequency of Social Gatherings with Friends and Family:   . Attends Religious Services:   . Active Member of Clubs or Organizations:    . Attends Banker Meetings:   Marland Kitchen Marital Status:      Family History: The patient's family history includes Cancer in his father; Heart Problems in his brother; Stroke in his mother. ROS:   Please see the history of present illness.    All other systems reviewed and are negative.  EKGs/Labs/Other Studies Reviewed:    The following studies were reviewed today:    Recent Labs: Reviewed renal function is stable potassium top normal lipids at target 05/29/2019: Hemoglobin 17.1; Platelets 354 02/04/2020: ALT 22; BUN 26; Creatinine, Ser 1.40; NT-Pro BNP 2,016; Potassium 5.3; Sodium 140  Recent Lipid Panel    Component Value  Date/Time   CHOL 149 02/04/2020 0911   TRIG 126 02/04/2020 0911   HDL 52 02/04/2020 0911   CHOLHDL 2.9 02/04/2020 0911   LDLCALC 75 02/04/2020 0911    Physical Exam:    VS:  BP (!) 164/80   Pulse 76   Ht 5\' 9"  (1.753 m)   Wt 187 lb (84.8 kg)   SpO2 96%   BMI 27.62 kg/m     Wt Readings from Last 3 Encounters:  05/06/20 187 lb (84.8 kg)  04/07/20 185 lb (83.9 kg)  02/04/20 188 lb 3.2 oz (85.4 kg)  Repeat blood pressure 1 4080 by me right upper extremity sitting  GEN:  Well nourished, well developed in no acute distress HEENT: Normal NECK: No JVD; No carotid bruits LYMPHATICS: No lymphadenopathy CARDIAC: S1 RRR, no murmurs, rubs, gallops RESPIRATORY:  Clear to auscultation without rales, wheezing or rhonchi  ABDOMEN: Soft, non-tender, non-distended MUSCULOSKELETAL:  No edema; No deformity  SKIN: Warm and dry NEUROLOGIC:  Alert and oriented x 3 PSYCHIATRIC:  Normal affect    Signed, 02/06/20, MD  05/06/2020 8:24 AM    [facility]

## 2020-05-06 ENCOUNTER — Ambulatory Visit (INDEPENDENT_AMBULATORY_CARE_PROVIDER_SITE_OTHER): Payer: 59 | Admitting: Cardiology

## 2020-05-06 ENCOUNTER — Encounter: Payer: Self-pay | Admitting: Cardiology

## 2020-05-06 ENCOUNTER — Other Ambulatory Visit: Payer: Self-pay

## 2020-05-06 VITALS — BP 164/80 | HR 76 | Ht 69.0 in | Wt 187.0 lb

## 2020-05-06 DIAGNOSIS — I110 Hypertensive heart disease with heart failure: Secondary | ICD-10-CM | POA: Diagnosis not present

## 2020-05-06 DIAGNOSIS — N182 Chronic kidney disease, stage 2 (mild): Secondary | ICD-10-CM

## 2020-05-06 DIAGNOSIS — I5022 Chronic systolic (congestive) heart failure: Secondary | ICD-10-CM

## 2020-05-06 NOTE — Patient Instructions (Signed)

## 2020-05-07 ENCOUNTER — Other Ambulatory Visit: Payer: Self-pay | Admitting: Cardiology

## 2020-05-07 MED ORDER — METOPROLOL SUCCINATE ER 25 MG PO TB24: 25.0000 mg | ORAL_TABLET | Freq: Two times a day (BID) | ORAL | 3 refills | Status: DC

## 2020-05-07 NOTE — Telephone Encounter (Signed)
Medication refilled per Dr. Hulen Shouts verbal ok.

## 2020-05-07 NOTE — Telephone Encounter (Signed)
*  STAT* If patient is at the pharmacy, call can be transferred to refill team.   1. Which medications need to be refilled? (please list name of each medication and dose if known) metoprolol succinate (TOPROL-XL) 25 MG 24 hr tablet  2. Which pharmacy/location (including street and city if local pharmacy) is medication to be sent to? [id number] - [place], [place] - [address]  3. Do they need a 30 day or 90 day supply? 90 days  Patient is still taking x2 a day.

## 2020-05-15 ENCOUNTER — Other Ambulatory Visit: Payer: Self-pay

## 2020-05-15 DIAGNOSIS — I5022 Chronic systolic (congestive) heart failure: Secondary | ICD-10-CM

## 2020-05-15 DIAGNOSIS — Z01812 Encounter for preprocedural laboratory examination: Secondary | ICD-10-CM

## 2020-05-15 LAB — BASIC METABOLIC PANEL
BUN/Creatinine Ratio: 16 (ref 10–24)
BUN: 22 mg/dL (ref 8–27)
CO2: 23 mmol/L (ref 20–29)
Calcium: 9.4 mg/dL (ref 8.6–10.2)
Chloride: 104 mmol/L (ref 96–106)
Creatinine, Ser: 1.41 mg/dL — ABNORMAL HIGH (ref 0.76–1.27)
GFR calc Af Amer: 61 mL/min/{1.73_m2} (ref >59)
GFR calc non Af Amer: 53 mL/min/{1.73_m2} — ABNORMAL LOW (ref >59)
Glucose: 82 mg/dL (ref 65–99)
Potassium: 4.7 mmol/L (ref 3.5–5.2)
Sodium: 138 mmol/L (ref 134–144)

## 2020-05-16 ENCOUNTER — Encounter: Payer: Self-pay | Admitting: *Deleted

## 2020-05-16 LAB — CBC
Hematocrit: 38.8 % (ref 37.5–51.0)
Hemoglobin: 13.2 g/dL (ref 13.0–17.7)
MCH: 32.4 pg (ref 26.6–33.0)
MCHC: 34 g/dL (ref 31.5–35.7)
MCV: 95 fL (ref 79–97)
Platelets: 276 10*3/uL (ref 150–450)
RBC: 4.07 x10E6/uL — ABNORMAL LOW (ref 4.14–5.80)
RDW: 12.2 % (ref 11.6–15.4)
WBC: 10 10*3/uL (ref 3.4–10.8)

## 2020-05-26 ENCOUNTER — Other Ambulatory Visit (HOSPITAL_COMMUNITY)
Admission: RE | Admit: 2020-05-26 | Discharge: 2020-05-26 | Disposition: A | Discharge status: Home / Self Care | Payer: 59 | Source: Ambulatory Visit | Attending: Cardiology | Admitting: Cardiology

## 2020-05-26 DIAGNOSIS — Z20822 Contact with and (suspected) exposure to covid-19: Secondary | ICD-10-CM | POA: Insufficient documentation

## 2020-05-26 DIAGNOSIS — Z01812 Encounter for preprocedural laboratory examination: Secondary | ICD-10-CM | POA: Insufficient documentation

## 2020-05-26 LAB — SARS CORONAVIRUS 2 (TAT 6-24 HRS): SARS Coronavirus 2: NEGATIVE

## 2020-05-28 ENCOUNTER — Encounter (HOSPITAL_COMMUNITY)
Admission: RE | Disposition: A | Discharge status: Home / Self Care | Payer: 59 | Source: Home / Self Care | Attending: Cardiology

## 2020-05-28 ENCOUNTER — Ambulatory Visit (HOSPITAL_COMMUNITY): Payer: 59

## 2020-05-28 ENCOUNTER — Other Ambulatory Visit: Payer: Self-pay

## 2020-05-28 ENCOUNTER — Ambulatory Visit (HOSPITAL_COMMUNITY)
Admission: RE | Admit: 2020-05-28 | Discharge: 2020-05-28 | Disposition: A | Discharge status: Home / Self Care | Payer: 59 | Attending: Cardiology | Admitting: Cardiology

## 2020-05-28 DIAGNOSIS — Z006 Encounter for examination for normal comparison and control in clinical research program: Secondary | ICD-10-CM | POA: Diagnosis not present

## 2020-05-28 DIAGNOSIS — I130 Hypertensive heart and chronic kidney disease with heart failure and stage 1 through stage 4 chronic kidney disease, or unspecified chronic kidney disease: Secondary | ICD-10-CM | POA: Insufficient documentation

## 2020-05-28 DIAGNOSIS — Z7901 Long term (current) use of anticoagulants: Secondary | ICD-10-CM | POA: Diagnosis not present

## 2020-05-28 DIAGNOSIS — Z8673 Personal history of transient ischemic attack (TIA), and cerebral infarction without residual deficits: Secondary | ICD-10-CM | POA: Diagnosis not present

## 2020-05-28 DIAGNOSIS — E7800 Pure hypercholesterolemia, unspecified: Secondary | ICD-10-CM | POA: Diagnosis not present

## 2020-05-28 DIAGNOSIS — I420 Dilated cardiomyopathy: Secondary | ICD-10-CM | POA: Insufficient documentation

## 2020-05-28 DIAGNOSIS — F1021 Alcohol dependence, in remission: Secondary | ICD-10-CM | POA: Diagnosis not present

## 2020-05-28 DIAGNOSIS — I5022 Chronic systolic (congestive) heart failure: Secondary | ICD-10-CM | POA: Insufficient documentation

## 2020-05-28 DIAGNOSIS — N182 Chronic kidney disease, stage 2 (mild): Secondary | ICD-10-CM | POA: Diagnosis not present

## 2020-05-28 DIAGNOSIS — Z87891 Personal history of nicotine dependence: Secondary | ICD-10-CM | POA: Insufficient documentation

## 2020-05-28 DIAGNOSIS — Z95818 Presence of other cardiac implants and grafts: Secondary | ICD-10-CM

## 2020-05-28 DIAGNOSIS — E785 Hyperlipidemia, unspecified: Secondary | ICD-10-CM | POA: Diagnosis not present

## 2020-05-28 DIAGNOSIS — I428 Other cardiomyopathies: Secondary | ICD-10-CM | POA: Diagnosis not present

## 2020-05-28 HISTORY — PX: ICD IMPLANT: EP1208

## 2020-05-28 SURGERY — ICD IMPLANT

## 2020-05-28 MED ORDER — HEPARIN (PORCINE) IN NACL 1000-0.9 UT/500ML-% IV SOLN
INTRAVENOUS | Status: AC
  Filled 2020-05-28: qty 500

## 2020-05-28 MED ORDER — MIDAZOLAM HCL 5 MG/5ML IJ SOLN
INTRAMUSCULAR | Status: DC | PRN
  Administered 2020-05-28 (×2): 1 mg via INTRAVENOUS

## 2020-05-28 MED ORDER — LIDOCAINE HCL (PF) 1 % IJ SOLN
INTRAMUSCULAR | Status: AC
  Filled 2020-05-28: qty 60

## 2020-05-28 MED ORDER — SODIUM CHLORIDE 0.9 % IV SOLN: INTRAVENOUS | Status: DC

## 2020-05-28 MED ORDER — CEFAZOLIN SODIUM-DEXTROSE 2-4 GM/100ML-% IV SOLN
2.0000 g | INTRAVENOUS | Status: AC
  Administered 2020-05-28: 2 g via INTRAVENOUS

## 2020-05-28 MED ORDER — SODIUM CHLORIDE 0.9 % IV SOLN
80.0000 mg | INTRAVENOUS | Status: AC
  Administered 2020-05-28: 80 mg

## 2020-05-28 MED ORDER — LIDOCAINE HCL (PF) 1 % IJ SOLN
INTRAMUSCULAR | Status: DC | PRN
  Administered 2020-05-28: 40 mL

## 2020-05-28 MED ORDER — CHLORHEXIDINE GLUCONATE 4 % EX LIQD
4.0000 "application " | Freq: Once | CUTANEOUS | Status: DC
  Filled 2020-05-28: qty 60

## 2020-05-28 MED ORDER — FENTANYL CITRATE (PF) 100 MCG/2ML IJ SOLN
INTRAMUSCULAR | Status: DC | PRN
  Administered 2020-05-28 (×2): 25 ug via INTRAVENOUS

## 2020-05-28 MED ORDER — CEFAZOLIN SODIUM-DEXTROSE 2-4 GM/100ML-% IV SOLN
INTRAVENOUS | Status: AC
  Filled 2020-05-28: qty 100

## 2020-05-28 MED ORDER — HEPARIN (PORCINE) IN NACL 1000-0.9 UT/500ML-% IV SOLN
INTRAVENOUS | Status: DC | PRN
  Administered 2020-05-28: 500 mL

## 2020-05-28 MED ORDER — ONDANSETRON HCL 4 MG/2ML IJ SOLN: 4.0000 mg | Freq: Four times a day (QID) | INTRAMUSCULAR | Status: DC | PRN

## 2020-05-28 MED ORDER — CEFAZOLIN SODIUM-DEXTROSE 1-4 GM/50ML-% IV SOLN
1.0000 g | Freq: Four times a day (QID) | INTRAVENOUS | Status: DC
  Administered 2020-05-28: 1 g via INTRAVENOUS
  Filled 2020-05-28: qty 50

## 2020-05-28 MED ORDER — MIDAZOLAM HCL 5 MG/5ML IJ SOLN
INTRAMUSCULAR | Status: AC
  Filled 2020-05-28: qty 5

## 2020-05-28 MED ORDER — SODIUM CHLORIDE 0.9 % IV SOLN
INTRAVENOUS | Status: AC
  Filled 2020-05-28: qty 2

## 2020-05-28 MED ORDER — FENTANYL CITRATE (PF) 100 MCG/2ML IJ SOLN
INTRAMUSCULAR | Status: AC
  Filled 2020-05-28: qty 2

## 2020-05-28 MED ORDER — ACETAMINOPHEN 325 MG PO TABS
325.0000 mg | ORAL_TABLET | ORAL | Status: DC | PRN
  Filled 2020-05-28: qty 2

## 2020-05-28 SURGICAL SUPPLY — 7 items
CABLE SURGICAL S-101-97-12 (CABLE) ×3 IMPLANT
ICD VISIA MRI VR DVFB1D4 (ICD Generator) ×1 IMPLANT
LEAD SPRINT QUAT SEC 6935M-62 (Lead) ×3 IMPLANT
PAD PRO RADIOLUCENT 2001M-C (PAD) ×3 IMPLANT
SHEATH 9FR PRELUDE SNAP 13 (SHEATH) ×3 IMPLANT
TRAY PACEMAKER INSERTION (PACKS) ×3 IMPLANT
VISIA MRI VR DVFB1D4 (ICD Generator) ×3 IMPLANT

## 2020-05-28 NOTE — Discharge Instructions (Signed)
After Your ICD (Implantable Cardiac Defibrillator)   Tomorrow, 05/29/2020, please send a device transmission  . You have a Medtronic ICD  ACTIVITY . Do not lift your arm above shoulder height for 1 week after your procedure. After 7 days, you may progress as below.     Wednesday June 04, 2020  Thursday June 05, 2020 Friday June 06, 2020 Saturday June 07, 2020   . Do not lift, push, pull, or carry anything over 10 pounds with the affected arm until 6 weeks (Wednesday July 09, 2020 ) after your procedure.   . Do NOT DRIVE until you have been seen for your wound check, or as long as instructed by your healthcare provider.   . Ask your healthcare provider when you can go back to work   INCISION/Dressing . Remove the arm sling tomorrow, 05/29/2020  . Monitor your defibrillator site for redness, swelling, and drainage. Call the device clinic at [phone] if you experience these symptoms or fever/chills.  . Remove the outer plastic bandage tomorrow, 05/29/20, underneath your incision is sealed with Steri-strips, Do not remove the steri-strips  . you may shower 10 days after your procedure or when told by your provider, do not let the shower hit directly on your site. You may wash around your site with soap and water.     Marland Kitchen Avoid lotions, ointments, or perfumes over your incision until it is well-healed.  . You may use a hot tub or a pool AFTER your wound check appointment if the incision is completely closed.  . Your ICD is designed to protect you from life threatening heart rhythms. Because of this, you may receive a shock.   o 1 shock with no symptoms:  Call the office during business hours. o 1 shock with symptoms (chest pain, chest pressure, dizziness, lightheadedness, shortness of breath, overall feeling unwell):  Call 911. o If you experience 2 or more shocks in 24 hours:  Call 911. o If you receive a shock, you should not drive for 6 months per the [place] DMV IF you receive  appropriate therapy from your ICD.   . ICD Alerts:  Some alerts are vibratory and others beep. These are NOT emergencies. Please call our office to let us know. If this occurs at night or on weekends, it can wait until the next business day. Send a remote transmission.  . If your device is capable of reading fluid status (for heart failure), you will be offered monthly monitoring to review this with you.   DEVICE MANAGEMENT . Remote monitoring is used to monitor your ICD from home. This monitoring is scheduled every 91 days by our office. It allows [place] to keep an eye on the functioning of your device to ensure it is working properly. You will routinely see your Electrophysiologist annually (more often if necessary).   . You should receive your ID card for your new device in 4-8 weeks. Keep this card with you at all times once received. Consider wearing a medical alert bracelet or necklace.  . Your ICD  may be MRI compatible. This will be discussed at your next office visit/wound check.  You should avoid contact with strong electric or magnetic fields.    Do not use amateur (ham) radio equipment or electric (arc) welding torches. MP3 player headphones with magnets should not be used. Some devices are safe to use if held at least 12 inches (30 cm) from your defibrillator. These include power tools, lawn mowers, and speakers.  If you are unsure if something is safe to use, ask your health care provider.   When using your cell phone, hold it to the ear that is on the opposite side from the defibrillator. Do not leave your cell phone in a pocket over the defibrillator.   You may safely use electric blankets, heating pads, computers, and microwave ovens.  Call the office right away if:  You have chest pain.  You feel more than one shock.  You feel more short of breath than you have felt before.  You feel more light-headed than you have felt before.  Your incision starts to open up.  This  information is not intended to replace advice given to you by your health care provider. Make sure you discuss any questions you have with your health care provider.

## 2020-05-28 NOTE — H&P (Signed)
Electrophysiology Office Note   Date:  05/28/2020   ID:  Kenneth Pham, DOB [birth year], MRN [id number]  PCP:  Olive Bass, MD  Cardiologist:  Dulce Sellar  Primary Electrophysiologist:  Shara Hartis [name], MD    Chief Complaint: CHF, PVCs   History of Present Illness: Kenneth Pham is a 64 y.o. male who is being seen today for the evaluation of CHF at the request of No ref. provider found. Presenting today for electrophysiology evaluation.  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   He has NYHA class II symptoms.  He gets short of breath when walking long distances.  He did have an episode where he blacked out and fell.  He wore a 3-day ZIO monitor and was referred to EP for ICD consideration.  Today, denies symptoms of palpitations, chest pain, shortness of breath, orthopnea, PND, lower extremity edema, claudication, dizziness, presyncope, syncope, bleeding, or neurologic sequela. The patient is tolerating medications without difficulties.    Past Medical History:  Diagnosis Date  . Acute CHF (HCC) 05/16/2019  . Alcohol dependence in remission (HCC) 05/16/2019  . Arteriosclerosis of carotid artery, right 05/15/2019  . Cerebellar stroke, acute (HCC) 05/09/2019  . Chronic anticoagulation 07/13/2019  . CKD (chronic kidney disease) stage 2, GFR 60-89 ml/min 05/29/2019  . Dilated cardiomyopathy (HCC) 05/29/2019  . Heart failure with reduced ejection fraction (HCC) 05/09/2019  . Hypercholesteremia   . Hypertension   . Hypertensive heart disease with heart failure (HCC) 05/29/2019  . Precordial chest pain 08/21/2019  . Pulmonary nodules 05/15/2019   05/01/2019 CT angio - 8.2mm bandlike nodular density lingula - 5.42mm R middle lobe - 61.5 pack year hx, rec 3 month f/u  . Shortness of breath 08/21/2019  . Snores 05/29/2019   Past Surgical History:  Procedure Laterality  Date  . APPENDECTOMY    . CATARACT EXTRACTION Right   . TONSILLECTOMY       Current Facility-Administered Medications  Medication Dose Route Frequency Provider Last Rate Last Admin  . 0.9 %  sodium chloride infusion   Intravenous Continuous Regan Lemming, MD 50 mL/hr at 05/28/20 1018 New Bag at 05/28/20 1018  . ceFAZolin (ANCEF) IVPB 2g/100 mL premix  2 g Intravenous To Cath Lynsey Ange Daphine Deutscher, MD      . chlorhexidine (HIBICLENS) 4 % liquid 4 application  4 application Topical Once Megin Consalvo Daphine Deutscher, MD      . gentamicin (GARAMYCIN) 80 mg in sodium chloride 0.9 % 500 mL irrigation  80 mg Irrigation To Cath Scottlyn Mchaney, Andree Coss, MD        Allergies:   Patient has no known allergies.   Social History:  The patient  reports that he quit smoking about 12 months ago. His smoking use included cigarettes. He has never used smokeless tobacco. He reports previous alcohol use. He reports previous drug use. Drug: Marijuana.   Family History:  The patient's family history includes Cancer in his father; Heart Problems in his brother; Stroke in his mother.    ROS:  Please see the history of present illness.   Otherwise, review of systems is positive for none.   All other systems are reviewed and negative.    PHYSICAL EXAM: VS:  BP (!) 165/82   Pulse 65   Temp 97.9 F (36.6 C) (Oral)   Resp 18   Ht 5\' 9"  (1.753 m)   Wt  84.8 kg   SpO2 100%   BMI 27.62 kg/m  , BMI Body mass index is 27.62 kg/m. GEN: Well nourished, well developed, in no acute distress  HEENT: normal  Neck: no JVD, carotid bruits, or masses Cardiac: RRR; no murmurs, rubs, or gallops,no edema  Respiratory:  clear to auscultation bilaterally, normal work of breathing GI: soft, nontender, nondistended, + BS MS: no deformity or atrophy  Skin: warm and dry,  Neuro:  Strength and sensation are intact Psych: euthymic mood, full affect   Recent Labs: 02/04/2020: ALT 22; NT-Pro BNP 2,016 05/15/2020: BUN 22;  Creatinine, Ser 1.41; Hemoglobin 13.2; Platelets 276; Potassium 4.7; Sodium 138    Lipid Panel     Component Value Date/Time   CHOL 149 02/04/2020 0911   TRIG 126 02/04/2020 0911   HDL 52 02/04/2020 0911   CHOLHDL 2.9 02/04/2020 0911   LDLCALC 75 02/04/2020 0911     Wt Readings from Last 3 Encounters:  05/28/20 84.8 kg  05/06/20 84.8 kg  04/07/20 83.9 kg      Other studies Reviewed: Additional studies/ records that were reviewed today include: TTE 08/02/19  Review of the above records today demonstrates:  1. The left ventricle has moderate-severely reduced systolic function,  with an ejection fraction of 30-35%. There is diffuse hypokinesis of the  Left ventricle. There is mild concentric left ventricular hypertrophy.  Left ventricular diastolic Doppler  parameters are consistent with pseudonormalization. Elevated mean left  atrial pressure.  2. There is abnormal global logitudinal strain (-7.5%).  3. The right ventricle has normal systolic function.  4. Left atrial and Right atrial size was normal.  5. Mild thickening of the aortic valve. Mild calcification of the aortic  valve. Aortic valve regurgitation was not visualized by color flow  Doppler.   Monitor 02/19/20 personally  Reviewed The rhythm throughout was sinus with minimum average and maximum heart rates of 51, 81 and 128 bpm. Ventricular ectopy was occasional PVCs and rare couplets and triplets.  There were 11 runs of PVCs the fastest 12 complexes at a rate of 163 bpm and the longest 16.2 seconds at a relatively slow rate of 113 bpm.    ASSESSMENT AND PLAN:  1.  Chronic systolic heart failure due to dilated cardiomyopathy: NYHA class II. On OMT. Plan for ICD for primary prevention.  ICD Criteria  Current LVEF:33%. Within 12 months prior to implant: Yes   Heart failure history: Yes, Class II  Cardiomyopathy history: Yes, Non-Ischemic Cardiomyopathy.  Atrial Fibrillation/Atrial Flutter:  No.  Ventricular tachycardia history: No.  Cardiac arrest history: No.  History of syndromes with risk of sudden death: No.  Previous ICD: No.  Current ICD indication: Primary  PPM indication: No.  Class I or II Bradycardia indication present: No  Beta Blocker therapy for 3 or more months: Yes, prescribed.   Ace Inhibitor/ARB therapy for 3 or more months: Yes, prescribed.    I have seen Kenneth Pham is a 64 y.o. malepre-procedural and has been referred by [facility] for consideration of ICD implant for primary prevention of sudden death.  The patient's chart has been reviewed and they meet criteria for ICD implant.  I have had a thorough discussion with the patient reviewing options.  The patient and their family (if available) have had opportunities to ask questions and have them answered. The patient and I have decided together through the [facility] Care Share Decision Support Tool to implant ICD at this time.  Risks, benefits, alternatives to ICD  implantation were discussed in detail with the patient today. The patient  understands that the risks include but are not limited to bleeding, infection, pneumothorax, perforation, tamponade, vascular damage, renal failure, MI, stroke, death, inappropriate shocks, and lead dislodgement and  wishes to proceed.

## 2020-05-29 ENCOUNTER — Telehealth: Payer: Self-pay | Admitting: Emergency Medicine

## 2020-05-29 ENCOUNTER — Encounter (HOSPITAL_COMMUNITY): Payer: Self-pay | Admitting: Cardiology

## 2020-05-29 NOTE — Telephone Encounter (Signed)
LMOM for patient to call office. Office # and office hours provided.  Same day discharge check in after Medtronic  Visia AF implant 05/28/20. In [facility] system there has been no recent communication with his home monitor.

## 2020-05-30 NOTE — Telephone Encounter (Signed)
Follow-up after same day discharge: Implant date: 05/28/20 MD: Loman Brooklyn, MD Device: ICD Location: Left chest   Wound check visit: 06/10/20 [facility] 90 day MD follow-up: 09/08/20 [place]  Remote Transmission received:Assisted pt with sending, transmission received and reviewed, no concerns  Dressing removed: Yes by pt, scant amount of blood with removal of dressing, no active bleeding since.  Pt denies any redness, swelling or drainage.  Pain is tolerable.    Reviewed discharge instructions for activity restrictions.  Questions answered.

## 2020-06-02 ENCOUNTER — Telehealth: Payer: Self-pay

## 2020-06-02 NOTE — Telephone Encounter (Signed)
   [facility] Pre-operative Risk Assessment    HEARTCARE STAFF: - Please ensure there is not already an duplicate clearance open for this procedure. - Under Visit Info/Reason for Call, type in Other and utilize the format Clearance MM/DD/YY or Clearance TBD. Do not use dashes or single digits. - If request is for dental extraction, please clarify the # of teeth to be extracted.  Request for surgical clearance:  1. What type of surgery is being performed? Cataract Extraction By PE, IOL-Left   2. When is this surgery scheduled? 06/25/20   3. What type of clearance is required (medical clearance vs. Pharmacy clearance to hold med vs. Both)? Both  4. Are there any medications that need to be held prior to surgery and how long?None specified   5. Practice name and name of physician performing surgery? [facility]   6. What is the office phone number? [phone]   7.   What is the office fax number? [phone]  8.   Anesthesia type (None, local, MAC, general) ? Local   Gita Kudo 06/02/2020, 2:57 PM  _________________________________________________________________   (provider comments below)

## 2020-06-02 NOTE — Telephone Encounter (Signed)
Judeth Cornfield from [facility] is calling to discuss this patients past medical history. Please advise.

## 2020-06-02 NOTE — Telephone Encounter (Signed)
   Primary Cardiologist: No primary care provider on file.  Chart reviewed as part of pre-operative protocol coverage. Cataract extractions are recognized in guidelines as low risk surgeries that do not typically require specific preoperative testing or holding of blood thinner therapy. Therefore, given past medical history and time since last visit, based on ACC/AHA guidelines, Axil Copeman would be at acceptable risk for the planned procedure without further cardiovascular testing.   I will route this recommendation to the requesting party via Epic fax function and remove from pre-op pool.  Please call with questions.  Corine Shelter, PA-C 06/02/2020, 3:12 PM

## 2020-06-03 NOTE — Telephone Encounter (Signed)
Judeth Cornfield called back and states she faxed a new clearance request for this patient around 9:30 am 06/03/20. The office had a box marked incorrectly on their end. Judeth Cornfield just wanted to make our office aware that the duplicate request sent was intentional. Please call her at [phone] if the office has any questions

## 2020-06-10 ENCOUNTER — Ambulatory Visit (INDEPENDENT_AMBULATORY_CARE_PROVIDER_SITE_OTHER): Payer: 59 | Admitting: Emergency Medicine

## 2020-06-10 ENCOUNTER — Other Ambulatory Visit: Payer: Self-pay

## 2020-06-10 DIAGNOSIS — I420 Dilated cardiomyopathy: Secondary | ICD-10-CM

## 2020-06-10 LAB — CUP PACEART INCLINIC DEVICE CHECK
Battery Remaining Longevity: 137 mo
Battery Voltage: 3.15 V
Brady Statistic RV Percent Paced: 0.23 %
Date Time Interrogation Session: 20210727163400
HighPow Impedance: 83 Ohm
Implantable Lead Implant Date: 20210714
Implantable Lead Location: 753860
Implantable Pulse Generator Implant Date: 20210714
Lead Channel Impedance Value: 494 Ohm
Lead Channel Impedance Value: 532 Ohm
Lead Channel Pacing Threshold Amplitude: 0.5 V
Lead Channel Pacing Threshold Pulse Width: 0.4 ms
Lead Channel Sensing Intrinsic Amplitude: 10.125 mV
Lead Channel Setting Pacing Amplitude: 3.5 V
Lead Channel Setting Pacing Pulse Width: 0.4 ms
Lead Channel Setting Sensing Sensitivity: 0.3 mV

## 2020-06-10 NOTE — Progress Notes (Signed)

## 2020-06-18 ENCOUNTER — Telehealth: Payer: Self-pay | Admitting: Neurology

## 2020-06-18 NOTE — Telephone Encounter (Signed)
Received a call from [place], NP with [facility] associates. The patient is scheduled to have a surgery done on his eye on 06/25/2020 and they have attempted to reach out to our office for medical clearance. I advised I am unaware if the form has come through but in reviewing the chart the patient has not followed up with our office since 05/2019. I advised that this may need to be cleared by the patient's PCP since we have not followed him and don't prescribe any medications. Pt was suppose to originally follow up 2 mths later and was scheduled 07/2019 but cancelled the apt The NP will reach out to the patient to see about him either following up here or discussing clearance by the PCP

## 2020-06-19 DIAGNOSIS — I4729 Other ventricular tachycardia: Secondary | ICD-10-CM

## 2020-06-19 HISTORY — DX: Other ventricular tachycardia: I47.29

## 2020-06-23 NOTE — Telephone Encounter (Signed)
Dr. Pearlean Brownie cleared pt neurologically. Faxed signed paperwork back to [facility] at [phone]. Received fax confirmation.

## 2020-08-28 ENCOUNTER — Ambulatory Visit (INDEPENDENT_AMBULATORY_CARE_PROVIDER_SITE_OTHER): Payer: 59

## 2020-08-28 DIAGNOSIS — I420 Dilated cardiomyopathy: Secondary | ICD-10-CM

## 2020-08-28 LAB — CUP PACEART REMOTE DEVICE CHECK
Battery Remaining Longevity: 136 mo
Battery Voltage: 3.15 V
Brady Statistic RV Percent Paced: 0.09 %
Date Time Interrogation Session: 20211014012503
HighPow Impedance: 88 Ohm
Implantable Lead Implant Date: 20210714
Implantable Lead Location: 753860
Implantable Pulse Generator Implant Date: 20210714
Lead Channel Impedance Value: 703 Ohm
Lead Channel Impedance Value: 817 Ohm
Lead Channel Pacing Threshold Amplitude: 0.5 V
Lead Channel Pacing Threshold Pulse Width: 0.4 ms
Lead Channel Sensing Intrinsic Amplitude: 11.375 mV
Lead Channel Sensing Intrinsic Amplitude: 11.375 mV
Lead Channel Setting Pacing Amplitude: 3.25 V
Lead Channel Setting Pacing Pulse Width: 0.4 ms
Lead Channel Setting Sensing Sensitivity: 0.3 mV

## 2020-09-02 ENCOUNTER — Other Ambulatory Visit: Payer: Self-pay | Admitting: Cardiology

## 2020-09-02 NOTE — Telephone Encounter (Signed)
Refill sent to pharmacy.   

## 2020-09-02 NOTE — Progress Notes (Signed)
Remote ICD transmission.   

## 2020-09-08 ENCOUNTER — Ambulatory Visit (INDEPENDENT_AMBULATORY_CARE_PROVIDER_SITE_OTHER): Payer: 59 | Admitting: Cardiology

## 2020-09-08 ENCOUNTER — Encounter: Payer: Self-pay | Admitting: Cardiology

## 2020-09-08 ENCOUNTER — Other Ambulatory Visit: Payer: Self-pay

## 2020-09-08 VITALS — BP 150/62 | HR 81 | Ht 70.0 in | Wt 188.0 lb

## 2020-09-08 DIAGNOSIS — I420 Dilated cardiomyopathy: Secondary | ICD-10-CM

## 2020-09-08 DIAGNOSIS — I5022 Chronic systolic (congestive) heart failure: Secondary | ICD-10-CM

## 2020-09-08 DIAGNOSIS — Z79899 Other long term (current) drug therapy: Secondary | ICD-10-CM | POA: Diagnosis not present

## 2020-09-08 LAB — BASIC METABOLIC PANEL
BUN/Creatinine Ratio: 19 (ref 10–24)
BUN: 30 mg/dL — ABNORMAL HIGH (ref 8–27)
CO2: 26 mmol/L (ref 20–29)
Calcium: 9.5 mg/dL (ref 8.6–10.2)
Chloride: 97 mmol/L (ref 96–106)
Creatinine, Ser: 1.56 mg/dL — ABNORMAL HIGH (ref 0.76–1.27)
GFR calc Af Amer: 54 mL/min/{1.73_m2} — ABNORMAL LOW (ref >59)
GFR calc non Af Amer: 47 mL/min/{1.73_m2} — ABNORMAL LOW (ref >59)
Glucose: 85 mg/dL (ref 65–99)
Potassium: 5.3 mmol/L — ABNORMAL HIGH (ref 3.5–5.2)
Sodium: 138 mmol/L (ref 134–144)

## 2020-09-08 MED ORDER — CARVEDILOL 12.5 MG PO TABS
12.5000 mg | ORAL_TABLET | Freq: Two times a day (BID) | ORAL | 11 refills | Status: DC
Start: 2020-09-08 — End: 2021-09-22

## 2020-09-08 NOTE — Patient Instructions (Addendum)
Medication Instructions:  Your physician has recommended you make the following change in your medication:  1. STOP Metoprolol Succinate (Toprol) 2. START Carvedilol (Coreg) 12.5 mg twice a day  *If you need a refill on your cardiac medications before your next appointment, please call your pharmacy*   Lab Work: Today: BMET If you have labs (blood work) drawn today and your tests are completely normal, you will receive your results only by: Marland Kitchen MyChart Message (if you have MyChart) OR . A paper copy in the mail If you have any lab test that is abnormal or we need to change your treatment, we will call you to review the results.   Testing/Procedures: None ordered   Follow-Up: At [facility], you and your health needs are our priority.  As part of our continuing mission to provide you with exceptional heart care,bmet  we have created designated Provider Care Teams.  These Care Teams include your primary Cardiologist (physician) and Advanced Practice Providers (APPs -  Physician Assistants and Nurse Practitioners) who all work together to provide you with the care you need, when you need it.  We recommend signing up for the patient portal called "MyChart".  Sign up information is provided on this After Visit Summary.  MyChart is used to connect with patients for Virtual Visits (Telemedicine).  Patients are able to view lab/test results, encounter notes, upcoming appointments, etc.  Non-urgent messages can be sent to your provider as well.   To learn more about what you can do with MyChart, go to [email or web address].    Remote monitoring is used to monitor your Pacemaker or ICD from home. This monitoring reduces the number of office visits required to check your device to one time per year. It allows [place] to keep an eye on the functioning of your device to ensure it is working properly. You are scheduled for a device check from home on 11/27/2020. You may send your transmission at any time  that day. If you have a wireless/ device, the transmission will be sent automatically. After your physician reviews your transmission, you will receive a postcard with your next transmission date. / Your next appointment:   1 year(s)  The format for your next appointment:   In Person  Provider:   Loman Brooklyn, MD   Thank you for choosing [facility]!!   Dory Horn, RN [phone]    Other Instructions

## 2020-09-08 NOTE — Progress Notes (Signed)
Electrophysiology Office Note   Date:  09/08/2020   ID:  Kenneth Pham, DOB 27-Jul-1956, MRN [id number]  PCP:  Olive Bass, MD  Cardiologist:  Dulce Sellar  Primary Electrophysiologist:  Kemari Mares [name], MD    Chief Complaint: CHF, PVCs   History of Present Illness: Kenneth Pham is a 64 y.o. male who is being seen today for the evaluation of CHF at the request of [name], [name], MD. Presenting today for electrophysiology evaluation.  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  He has NYHA class II symptoms.  He is now status post Medtronic ICD implanted 05/28/2020.  Today, denies symptoms of palpitations, chest pain, shortness of breath, orthopnea, PND, lower extremity edema, claudication, dizziness, presyncope, syncope, bleeding, or neurologic sequela. The patient is tolerating medications without difficulties.  Since last being seen he has done well.  He does have some lower extremity pain that he says feels like a cramping pain.  He also have some shortness of breath and appears to be at his baseline.  His OptiVol was previously elevated, but his Lasix dose was increased and his OptiVol is come back down to normal.  Past Medical History:  Diagnosis Date  . Acute CHF (HCC) 05/16/2019  . Alcohol dependence in remission (HCC) 05/16/2019  . Arteriosclerosis of carotid artery, right 05/15/2019  . Cerebellar stroke, acute (HCC) 05/09/2019  . Chronic anticoagulation 07/13/2019  . CKD (chronic kidney disease) stage 2, GFR 60-89 ml/min 05/29/2019  . Dilated cardiomyopathy (HCC) 05/29/2019  . Heart failure with reduced ejection fraction (HCC) 05/09/2019  . Hypercholesteremia   . Hypertension   . Hypertensive heart disease with heart failure (HCC) 05/29/2019  . Precordial chest pain 08/21/2019  . Pulmonary nodules 05/15/2019   05/01/2019 CT angio - 8.69mm bandlike nodular density  lingula - 5.91mm R middle lobe - 61.5 pack year hx, rec 3 month f/u  . Shortness of breath 08/21/2019  . Snores 05/29/2019   Past Surgical History:  Procedure Laterality Date  . APPENDECTOMY    . CATARACT EXTRACTION Right   . ICD IMPLANT N/A 05/28/2020   Procedure: ICD IMPLANT;  Surgeon: Regan Lemming, MD;  Location: [facility] INVASIVE CV LAB;  Service: Cardiovascular;  Laterality: N/A;  . TONSILLECTOMY       Current Outpatient Medications  Medication Sig Dispense Refill  . aspirin EC 81 MG tablet Take 81 mg by mouth daily.     Marland Kitchen atorvastatin (LIPITOR) 80 MG tablet TAKE 1 TABLET (80 MG TOTAL) BY MOUTH DAILY. CHOLESTEROL (Patient taking differently: Take 80 mg by mouth daily. ) 90 tablet 2  . ENTRESTO 97-103 MG TAKE 1 TABLET BY MOUTH TWICE A DAY 180 tablet 0  . furosemide (LASIX) 20 MG tablet TAKE 1 TABLET BY MOUTH TWICE A DAY (Patient taking differently: Take 40 mg by mouth 2 (two) times daily. ) 180 tablet 1  . carvedilol (COREG) 12.5 MG tablet Take 1 tablet (12.5 mg total) by mouth 2 (two) times daily. 60 tablet 11   No current facility-administered medications for this visit.    Allergies:   Patient has no known allergies.   Social History:  The patient  reports that he quit smoking about 16 months ago. His smoking use included cigarettes. He has never used smokeless tobacco. He reports previous alcohol use. He reports previous drug use. Drug: Marijuana.   Family History:  The patient's family history includes  Cancer in his father; Heart Problems in his brother; Stroke in his mother.   ROS:  Please see the history of present illness.   Otherwise, review of systems is positive for none.   All other systems are reviewed and negative.   PHYSICAL EXAM: VS:  BP (!) 150/62   Pulse 81   Ht 5\' 10"  (1.778 m)   Wt 188 lb (85.3 kg)   SpO2 99%   BMI 26.98 kg/m  , BMI Body mass index is 26.98 kg/m. GEN: Well nourished, well developed, in no acute distress  HEENT: normal  Neck: no JVD,  carotid bruits, or masses Cardiac: RRR; no murmurs, rubs, or gallops,no edema  Respiratory:  clear to auscultation bilaterally, normal work of breathing GI: soft, nontender, nondistended, + BS MS: no deformity or atrophy  Skin: warm and dry, device site well healed Neuro:  Strength and sensation are intact Psych: euthymic mood, full affect  EKG:  EKG is ordered today. Personal review of the ekg ordered shows sinus rhythm, inferolateral T wave changes, rate 81  Personal review of the device interrogation today. Results in [facility]   Recent Labs: 02/04/2020: ALT 22; NT-Pro BNP 2,016 05/15/2020: BUN 22; Creatinine, Ser 1.41; Hemoglobin 13.2; Platelets 276; Potassium 4.7; Sodium 138    Lipid Panel     Component Value Date/Time   CHOL 149 02/04/2020 0911   TRIG 126 02/04/2020 0911   HDL 52 02/04/2020 0911   CHOLHDL 2.9 02/04/2020 0911   LDLCALC 75 02/04/2020 0911     Wt Readings from Last 3 Encounters:  09/08/20 188 lb (85.3 kg)  05/28/20 187 lb (84.8 kg)  05/06/20 187 lb (84.8 kg)      Other studies Reviewed: Additional studies/ records that were reviewed today include: TTE 08/02/19  Review of the above records today demonstrates:  1. The left ventricle has moderate-severely reduced systolic function,  with an ejection fraction of 30-35%. There is diffuse hypokinesis of the  Left ventricle. There is mild concentric left ventricular hypertrophy.  Left ventricular diastolic Doppler  parameters are consistent with pseudonormalization. Elevated mean left  atrial pressure.  2. There is abnormal global logitudinal strain (-7.5%).  3. The right ventricle has normal systolic function.  4. Left atrial and Right atrial size was normal.  5. Mild thickening of the aortic valve. Mild calcification of the aortic  valve. Aortic valve regurgitation was not visualized by color flow  Doppler.   Monitor 02/19/20 personally  Reviewed The rhythm throughout was sinus with minimum average  and maximum heart rates of 51, 81 and 128 bpm. Ventricular ectopy was occasional PVCs and rare couplets and triplets.  There were 11 runs of PVCs the fastest 12 complexes at a rate of 163 bpm and the longest 16.2 seconds at a relatively slow rate of 113 bpm.    ASSESSMENT AND PLAN:  1.  Chronic systolic failure due to dilated cardiomyopathy: NYHA class II.  Currently on Entresto and Toprol-XL.  He is now status post Medtronic ICD implanted 05/28/2020.  Device functioning appropriately.  Lasix dose was recently increased.  He is having some leg cramping.  We Kennetta Pavlovic check his labs to ensure that his potassium is normal.  2.  Hypertension: Elevated today.  We Janesia Joswick stop Toprol-XL and start him on carvedilol.  3.  Hyperlipidemia: Continue statin per primary cardiology   Current medicines are reviewed at length with the patient today.   The patient does not have concerns regarding his medicines.  The following changes  were made today: Stop metoprolol, start carvedilol  Labs/ tests ordered today include:  Orders Placed This Encounter  Procedures  . Basic metabolic panel  . EKG 12-Lead     Disposition:   FU with Kazuma Elena 9 months  Signed, Clista Rainford [name], MD  09/08/2020 9:44 AM     [facility] [address] [place] [address] [phone] (office) [phone] (fax)

## 2020-09-09 ENCOUNTER — Other Ambulatory Visit: Payer: Self-pay

## 2020-09-09 DIAGNOSIS — I420 Dilated cardiomyopathy: Secondary | ICD-10-CM

## 2020-09-09 DIAGNOSIS — I5022 Chronic systolic (congestive) heart failure: Secondary | ICD-10-CM

## 2020-09-09 DIAGNOSIS — I110 Hypertensive heart disease with heart failure: Secondary | ICD-10-CM

## 2020-09-09 MED ORDER — FUROSEMIDE 20 MG PO TABS: 40.0000 mg | ORAL_TABLET | Freq: Two times a day (BID) | ORAL | 3 refills | Status: DC

## 2020-09-10 ENCOUNTER — Telehealth: Payer: Self-pay | Admitting: *Deleted

## 2020-09-10 NOTE — Telephone Encounter (Signed)
Left message to call back  

## 2020-09-10 NOTE — Telephone Encounter (Signed)
-----   Message from Will [name], MD sent at 09/09/2020 10:10 AM EDT ----- Potassium mildly elevated.  Does not need repletion.  No changes.

## 2020-10-28 NOTE — Progress Notes (Signed)
Cardiology Office Note:    Date:  10/29/2020   ID:  Kenneth Pham, DOB [birth year], MRN [id number]  PCP:  Olive Bass, MD  Cardiologist:  Norman Herrlich, MD    Referring MD: Olive Bass, MD    ASSESSMENT:    1. Chronic systolic heart failure (HCC)   2. Hypertensive heart disease with heart failure (HCC)   3. CKD (chronic kidney disease) stage 2, GFR 60-89 ml/min   4. VT (ventricular tachycardia) (HCC)   5. ICD (implantable cardioverter-defibrillator) in place   6. Hypercholesteremia   7. History of cerebellar stroke   8. Stenosis of right carotid artery    PLAN:    In order of problems listed above:  1. Stable continue his current guideline directed therapy with loop diuretic beta-blocker maximally tolerated dose high-dose Entresto.  Next visit will consider SGLT2 inhibitor 2. Stable BP at target on guideline therapy 3. Stable recheck labs today 4. Stable after ICD he set up with home telemetry monitoring if he has frequent VT or device therapy would need amiodarone 5. Continue with statin check lipid profile liver function 6. Stable on antiplatelet and lipid-lowering therapy 7. Stable on medical therapy   Next appointment: 3 months   Medication Adjustments/Labs and Tests Ordered: Current medicines are reviewed at length with the patient today.  Concerns regarding medicines are outlined above.  No orders of the defined types were placed in this encounter.  No orders of the defined types were placed in this encounter.   Chief Complaint  Patient presents with   Follow-up   Congestive Heart Failure    History of Present Illness:    Kenneth Pham is a 64 y.o. male with a hx of chronic systolic heart failure hypertensive heart disease with heart failure dilated cardiomyopathy and stage II CKD. EF 30-35% seen 02/04/2020.  At that visit he was transitioned to [facility] and wore a 3-day monitor because of an episode of syncope.  Occasional PVCs were seen and couplets  and triplets and brief runs of ventricular premature contractions, nonsustained ventricular tachycardia.  He had been referred to EP for consideration of device therapy and  Medtronic ICD insertion on 09/05/2020 He was last seen 05/06/2020. Compliance with diet, lifestyle and medications: Yes  He continues to do well he is short of breath on an incline hurrying outdoors or stairs but has no edema shortness of breath chest pain palpitation or syncope and feels safer after ICD insertion he is compliant with his medications including statin without muscle pain or weakness. Past Medical History:  Diagnosis Date   Acute CHF (HCC) 05/16/2019   Alcohol dependence in remission (HCC) 05/16/2019   Arteriosclerosis of carotid artery, right 05/15/2019   Cerebellar stroke, acute (HCC) 05/09/2019   Chronic anticoagulation 07/13/2019   CKD (chronic kidney disease) stage 2, GFR 60-89 ml/min 05/29/2019   Dilated cardiomyopathy (HCC) 05/29/2019   Heart failure with reduced ejection fraction (HCC) 05/09/2019   Hypercholesteremia    Hypertension    Hypertensive heart disease with heart failure (HCC) 05/29/2019   Precordial chest pain 08/21/2019   Pulmonary nodules 05/15/2019   05/01/2019 CT angio - 8.22mm bandlike nodular density lingula - 5.66mm R middle lobe - 61.5 pack year hx, rec 3 month f/u   Shortness of breath 08/21/2019   Snores 05/29/2019    Past Surgical History:  Procedure Laterality Date   APPENDECTOMY     CATARACT EXTRACTION Right    ICD IMPLANT N/A 05/28/2020   Procedure: ICD IMPLANT;  Surgeon: Regan Lemming, MD;  Location: [facility] INVASIVE CV LAB;  Service: Cardiovascular;  Laterality: N/A;   TONSILLECTOMY      Current Medications: Current Meds  Medication Sig   aspirin EC 81 MG tablet Take 81 mg by mouth daily.    atorvastatin (LIPITOR) 80 MG tablet TAKE 1 TABLET (80 MG TOTAL) BY MOUTH DAILY. CHOLESTEROL (Patient taking differently: Take 80 mg by mouth daily.)   carvedilol  (COREG) 12.5 MG tablet Take 1 tablet (12.5 mg total) by mouth 2 (two) times daily.   ENTRESTO 97-103 MG TAKE 1 TABLET BY MOUTH TWICE A DAY   furosemide (LASIX) 20 MG tablet Take 2 tablets (40 mg total) by mouth 2 (two) times daily.     Allergies:   Patient has no known allergies.   Social History   Socioeconomic History   Marital status: Widowed    Spouse name: Not on file   Number of children: Not on file   Years of education: Not on file   Highest education level: Not on file  Occupational History   Not on file  Tobacco Use   Smoking status: Former Smoker    Types: Cigarettes    Quit date: 04/29/2019    Years since quitting: 1.5   Smokeless tobacco: Never Used  Vaping Use   Vaping Use: Never used  Substance and Sexual Activity   Alcohol use: Not Currently   Drug use: Not Currently    Types: Marijuana   Sexual activity: Yes  Other Topics Concern   Not on file  Social History Narrative   Not on file   Social Determinants of Health   Financial Resource Strain: Not on file  Food Insecurity: Not on file  Transportation Needs: Not on file  Physical Activity: Not on file  Stress: Not on file  Social Connections: Not on file     Family History: The patient's family history includes Cancer in his father; Heart Problems in his brother; Stroke in his mother. ROS:   Please see the history of present illness.    All other systems reviewed and are negative.  EKGs/Labs/Other Studies Reviewed:    The following studies were reviewed today:  EKG:  EKG ordered by EP 09/09/2019 independently reviewed shows sinus rhythm LVH repolarization changes  Recent Labs: 02/04/2020: ALT 22; NT-Pro BNP 2,016 05/15/2020: Hemoglobin 13.2; Platelets 276 09/08/2020: BUN 30; Creatinine, Ser 1.56; Potassium 5.3; Sodium 138  Recent Lipid Panel    Component Value Date/Time   CHOL 149 02/04/2020 0911   TRIG 126 02/04/2020 0911   HDL 52 02/04/2020 0911   CHOLHDL 2.9 02/04/2020  0911   LDLCALC 75 02/04/2020 0911    Physical Exam:    VS:  BP 126/82    Pulse 74    Ht 5\' 10"  (1.778 m)    Wt 189 lb 9.6 oz (86 kg)    SpO2 92%    BMI 27.20 kg/m     Wt Readings from Last 3 Encounters:  10/29/20 189 lb 9.6 oz (86 kg)  09/08/20 188 lb (85.3 kg)  05/28/20 187 lb (84.8 kg)     GEN:  Well nourished, well developed in no acute distress HEENT: Normal NECK: No JVD; No carotid bruits LYMPHATICS: No lymphadenopathy CARDIAC: RRR, no murmurs, rubs, gallops ICD pocket is normal without effusion or skin inflammation RESPIRATORY:  Clear to auscultation without rales, wheezing or rhonchi  ABDOMEN: Soft, non-tender, non-distended MUSCULOSKELETAL:  No edema; No deformity  SKIN: Warm and dry NEUROLOGIC:  Alert and oriented x 3 PSYCHIATRIC:  Normal affect    Signed, Norman Herrlich, MD  10/29/2020 10:22 AM    [facility]

## 2020-10-29 ENCOUNTER — Encounter: Payer: Self-pay | Admitting: Cardiology

## 2020-10-29 ENCOUNTER — Other Ambulatory Visit: Payer: Self-pay

## 2020-10-29 ENCOUNTER — Ambulatory Visit (INDEPENDENT_AMBULATORY_CARE_PROVIDER_SITE_OTHER): Payer: 59 | Admitting: Cardiology

## 2020-10-29 VITALS — BP 126/82 | HR 74 | Ht 70.0 in | Wt 189.6 lb

## 2020-10-29 DIAGNOSIS — Z95810 Presence of automatic (implantable) cardiac defibrillator: Secondary | ICD-10-CM

## 2020-10-29 DIAGNOSIS — N182 Chronic kidney disease, stage 2 (mild): Secondary | ICD-10-CM

## 2020-10-29 DIAGNOSIS — I5022 Chronic systolic (congestive) heart failure: Secondary | ICD-10-CM

## 2020-10-29 DIAGNOSIS — I110 Hypertensive heart disease with heart failure: Secondary | ICD-10-CM

## 2020-10-29 DIAGNOSIS — I6521 Occlusion and stenosis of right carotid artery: Secondary | ICD-10-CM

## 2020-10-29 DIAGNOSIS — E7800 Pure hypercholesterolemia, unspecified: Secondary | ICD-10-CM

## 2020-10-29 DIAGNOSIS — I472 Ventricular tachycardia, unspecified: Secondary | ICD-10-CM

## 2020-10-29 DIAGNOSIS — Z8673 Personal history of transient ischemic attack (TIA), and cerebral infarction without residual deficits: Secondary | ICD-10-CM

## 2020-10-29 LAB — COMPREHENSIVE METABOLIC PANEL
ALT: 18 IU/L (ref 0–44)
AST: 19 IU/L (ref 0–40)
Albumin/Globulin Ratio: 1.6 (ref 1.2–2.2)
Albumin: 4.3 g/dL (ref 3.8–4.8)
Alkaline Phosphatase: 87 IU/L (ref 44–121)
BUN/Creatinine Ratio: 13 (ref 10–24)
BUN: 18 mg/dL (ref 8–27)
Bilirubin Total: 0.3 mg/dL (ref 0.0–1.2)
CO2: 23 mmol/L (ref 20–29)
Calcium: 9.7 mg/dL (ref 8.6–10.2)
Chloride: 99 mmol/L (ref 96–106)
Creatinine, Ser: 1.39 mg/dL — ABNORMAL HIGH (ref 0.76–1.27)
GFR calc Af Amer: 62 mL/min/{1.73_m2} (ref >59)
GFR calc non Af Amer: 54 mL/min/{1.73_m2} — ABNORMAL LOW (ref >59)
Globulin, Total: 2.7 g/dL (ref 1.5–4.5)
Glucose: 104 mg/dL — ABNORMAL HIGH (ref 65–99)
Potassium: 4.9 mmol/L (ref 3.5–5.2)
Sodium: 137 mmol/L (ref 134–144)
Total Protein: 7 g/dL (ref 6.0–8.5)

## 2020-10-29 LAB — LIPID PANEL
Chol/HDL Ratio: 3.3 ratio (ref 0.0–5.0)
Cholesterol, Total: 139 mg/dL (ref 100–199)
HDL: 42 mg/dL (ref >39)
LDL Chol Calc (NIH): 78 mg/dL (ref 0–99)
Triglycerides: 100 mg/dL (ref 0–149)
VLDL Cholesterol Cal: 19 mg/dL (ref 5–40)

## 2020-10-29 LAB — PRO B NATRIURETIC PEPTIDE: NT-Pro BNP: 1637 pg/mL — ABNORMAL HIGH (ref 0–210)

## 2020-10-29 NOTE — Patient Instructions (Signed)
Medication Instructions:  Your physician recommends that you continue on your current medications as directed. Please refer to the Current Medication list given to you today.  *If you need a refill on your cardiac medications before your next appointment, please call your pharmacy*   Lab Work: Your physician recommends that you return for lab work in: TODAY CMP, Lipids, ProBNP If you have labs (blood work) drawn today and your tests are completely normal, you will receive your results only by: . MyChart Message (if you have MyChart) OR . A paper copy in the mail If you have any lab test that is abnormal or we need to change your treatment, we will call you to review the results.   Testing/Procedures: None   Follow-Up: At [facility], you and your health needs are our priority.  As part of our continuing mission to provide you with exceptional heart care, we have created designated Provider Care Teams.  These Care Teams include your primary Cardiologist (physician) and Advanced Practice Providers (APPs -  Physician Assistants and Nurse Practitioners) who all work together to provide you with the care you need, when you need it.  We recommend signing up for the patient portal called "MyChart".  Sign up information is provided on this After Visit Summary.  MyChart is used to connect with patients for Virtual Visits (Telemedicine).  Patients are able to view lab/test results, encounter notes, upcoming appointments, etc.  Non-urgent messages can be sent to your provider as well.   To learn more about what you can do with MyChart, go to [email or web address].    Your next appointment:   3 month(s)  The format for your next appointment:   In Person  Provider:   Brian Munley, MD   Other Instructions   

## 2020-10-30 ENCOUNTER — Telehealth: Payer: Self-pay

## 2020-10-30 NOTE — Telephone Encounter (Signed)
Spoke with patient regarding results and recommendation.  Patient verbalizes understanding and is agreeable to plan of care. Advised patient to call back with any issues or concerns.  

## 2020-11-27 ENCOUNTER — Ambulatory Visit (INDEPENDENT_AMBULATORY_CARE_PROVIDER_SITE_OTHER): Payer: 59

## 2020-11-27 DIAGNOSIS — I420 Dilated cardiomyopathy: Secondary | ICD-10-CM

## 2020-11-27 LAB — CUP PACEART REMOTE DEVICE CHECK
Battery Remaining Longevity: 135 mo
Battery Voltage: 3.11 V
Brady Statistic RV Percent Paced: 0.02 %
Date Time Interrogation Session: 20220113002204
HighPow Impedance: 97 Ohm
Implantable Lead Implant Date: 20210714
Implantable Lead Location: 753860
Implantable Pulse Generator Implant Date: 20210714
Lead Channel Impedance Value: 760 Ohm
Lead Channel Impedance Value: 779 Ohm
Lead Channel Pacing Threshold Amplitude: 0.5 V
Lead Channel Pacing Threshold Pulse Width: 0.4 ms
Lead Channel Sensing Intrinsic Amplitude: 13.5 mV
Lead Channel Sensing Intrinsic Amplitude: 13.5 mV
Lead Channel Setting Pacing Amplitude: 2 V
Lead Channel Setting Pacing Pulse Width: 0.4 ms
Lead Channel Setting Sensing Sensitivity: 0.3 mV

## 2020-11-29 ENCOUNTER — Other Ambulatory Visit: Payer: Self-pay | Admitting: Cardiology

## 2020-12-10 NOTE — Progress Notes (Signed)
Remote ICD transmission.   

## 2020-12-21 ENCOUNTER — Other Ambulatory Visit: Payer: Self-pay | Admitting: Cardiology

## 2020-12-21 DIAGNOSIS — I110 Hypertensive heart disease with heart failure: Secondary | ICD-10-CM

## 2020-12-21 DIAGNOSIS — I5022 Chronic systolic (congestive) heart failure: Secondary | ICD-10-CM

## 2020-12-21 DIAGNOSIS — I420 Dilated cardiomyopathy: Secondary | ICD-10-CM

## 2021-01-26 NOTE — Progress Notes (Signed)
Cardiology Office Note:    Date:  01/27/2021   ID:  Kenneth Pham, DOB 1956/01/16, MRN [id number]  PCP:  Kenneth Bass, MD  Cardiologist:  Kenneth Herrlich, MD    Referring MD: Kenneth Bass, MD    ASSESSMENT:    1. Chronic systolic heart failure (HCC)   2. Hypertensive heart disease with heart failure (HCC)    PLAN:    In order of problems listed above:  1. Overall Kenneth Pham continues to do well heart failure is compensated no fluid overload continue his current diuretic beta-blocker and Entresto.  Recheck renal function consider MRA in addition.  I think his ongoing symptoms are a mixture of deconditioning cardiomyopathy and lung disease I challenged him to get into a regular walking program recheck BMP proBNP. 2. Stable ICD followed in our clinic 3. Check BMP potassium with CKD before decision on MRA therapy   Next appointment: 6 months   Medication Adjustments/Labs and Tests Ordered: Current medicines are reviewed at length with the patient today.  Concerns regarding medicines are outlined above.  No orders of the defined types were placed in this encounter.  No orders of the defined types were placed in this encounter.   Chief Complaint  Patient presents with  . Follow-up  . Congestive Heart Failure    History of Present Illness:    Kenneth Pham is a 64 y.o. male with a hx of chronic systolic heart failure hypertensive heart disease with heart failure and dilated cardiomyopathy stage II CKD most recent ejection fraction 30 to 35% and ICD last seen 10/29/2020. Compliance with diet, lifestyle and medications yes  Overall doing well he is stable New York Heart Association class II symptoms of exertional shortness of breath longer distance or incline.  Otherwise feels well no fatigue orthopnea edema PND chest pain palpitation or syncope.  I challenged him to start walking 5 minutes a day and build up to 20 minutes daily and I think it would help his shortness of breath which  I think is both on the basis of cardiomyopathy and underlying lung disease.  Last device download 11/27/2020 shows normal parameters and function and no sustained arrhythmia. Past Medical History:  Diagnosis Date  . Acute CHF (HCC) 05/16/2019  . Alcohol dependence in remission (HCC) 05/16/2019  . Arteriosclerosis of carotid artery, right 05/15/2019  . Cerebellar stroke, acute (HCC) 05/09/2019  . Chronic anticoagulation 07/13/2019  . CKD (chronic kidney disease) stage 2, GFR 60-89 ml/min 05/29/2019  . Dilated cardiomyopathy (HCC) 05/29/2019  . Heart failure with reduced ejection fraction (HCC) 05/09/2019  . Hypercholesteremia   . Hypertension   . Hypertensive heart disease with heart failure (HCC) 05/29/2019  . Precordial chest pain 08/21/2019  . Pulmonary nodules 05/15/2019   05/01/2019 CT angio - 8.24mm bandlike nodular density lingula - 5.25mm R middle lobe - 61.5 pack year hx, rec 3 month f/u  . Shortness of breath 08/21/2019  . Snores 05/29/2019    Past Surgical History:  Procedure Laterality Date  . APPENDECTOMY    . CATARACT EXTRACTION Right   . ICD IMPLANT N/A 05/28/2020   Procedure: ICD IMPLANT;  Surgeon: Regan Lemming, MD;  Location: [facility] INVASIVE CV LAB;  Service: Cardiovascular;  Laterality: N/A;  . TONSILLECTOMY      Current Medications: Current Meds  Medication Sig  . aspirin EC 81 MG tablet Take 81 mg by mouth daily.   Marland Kitchen atorvastatin (LIPITOR) 80 MG tablet TAKE 1 TABLET (80 MG TOTAL) BY MOUTH DAILY.  CHOLESTEROL (Patient taking differently: Take 80 mg by mouth daily.)  . carvedilol (COREG) 12.5 MG tablet Take 1 tablet (12.5 mg total) by mouth 2 (two) times daily.  Marland Kitchen ENTRESTO 97-103 MG TAKE 1 TABLET BY MOUTH TWICE A DAY  . furosemide (LASIX) 20 MG tablet TAKE 2 TABLETS BY MOUTH 2 TIMES DAILY.     Allergies:   Patient has no known allergies.   Social History   Socioeconomic History  . Marital status: Widowed    Spouse name: Not on file  . Number of children: Not on file   . Years of education: Not on file  . Highest education level: Not on file  Occupational History  . Not on file  Tobacco Use  . Smoking status: Former Smoker    Types: Cigarettes    Quit date: 04/29/2019    Years since quitting: 1.7  . Smokeless tobacco: Never Used  Vaping Use  . Vaping Use: Never used  Substance and Sexual Activity  . Alcohol use: Not Currently  . Drug use: Not Currently    Types: Marijuana  . Sexual activity: Yes  Other Topics Concern  . Not on file  Social History Narrative  . Not on file   Social Determinants of Health   Financial Resource Strain: Not on file  Food Insecurity: Not on file  Transportation Needs: Not on file  Physical Activity: Not on file  Stress: Not on file  Social Connections: Not on file     Family History: The patient's family history includes Cancer in his father; Heart Problems in his brother; Stroke in his mother. ROS:   Please see the history of present illness.    All other systems reviewed and are negative.  EKGs/Labs/Other Studies Reviewed:    The following studies were reviewed today:   Recent Labs: 05/15/2020: Hemoglobin 13.2; Platelets 276 10/29/2020: ALT 18; BUN 18; Creatinine, Ser 1.39; NT-Pro BNP 1,637; Potassium 4.9; Sodium 137  Recent Lipid Panel    Component Value Date/Time   CHOL 139 10/29/2020 1032   TRIG 100 10/29/2020 1032   HDL 42 10/29/2020 1032   CHOLHDL 3.3 10/29/2020 1032   LDLCALC 78 10/29/2020 1032    Physical Exam:    VS:  BP 122/62   Pulse 80   Ht 5\' 10"  (1.778 m)   Wt 182 lb 9.6 oz (82.8 kg)   SpO2 99%   BMI 26.20 kg/m     Wt Readings from Last 3 Encounters:  01/27/21 182 lb 9.6 oz (82.8 kg)  10/29/20 189 lb 9.6 oz (86 kg)  09/08/20 188 lb (85.3 kg)     GEN:  Well nourished, well developed in no acute distress HEENT: Normal NECK: No JVD; No carotid bruits LYMPHATICS: No lymphadenopathy CARDIAC: RRR, no murmurs, rubs, gallops RESPIRATORY:  Clear to auscultation without  rales, wheezing or rhonchi  ABDOMEN: Soft, non-tender, non-distended MUSCULOSKELETAL:  No edema; No deformity  SKIN: Warm and dry NEUROLOGIC:  Alert and oriented x 3 PSYCHIATRIC:  Normal affect    Signed, 09/10/20, MD  01/27/2021 8:48 AM    [facility]

## 2021-01-27 ENCOUNTER — Ambulatory Visit (INDEPENDENT_AMBULATORY_CARE_PROVIDER_SITE_OTHER): Payer: 59 | Admitting: Cardiology

## 2021-01-27 ENCOUNTER — Encounter: Payer: Self-pay | Admitting: Cardiology

## 2021-01-27 ENCOUNTER — Other Ambulatory Visit: Payer: Self-pay

## 2021-01-27 VITALS — BP 122/62 | HR 80 | Ht 70.0 in | Wt 182.6 lb

## 2021-01-27 DIAGNOSIS — N182 Chronic kidney disease, stage 2 (mild): Secondary | ICD-10-CM

## 2021-01-27 DIAGNOSIS — Z95810 Presence of automatic (implantable) cardiac defibrillator: Secondary | ICD-10-CM | POA: Diagnosis not present

## 2021-01-27 DIAGNOSIS — I5022 Chronic systolic (congestive) heart failure: Secondary | ICD-10-CM | POA: Diagnosis not present

## 2021-01-27 DIAGNOSIS — I110 Hypertensive heart disease with heart failure: Secondary | ICD-10-CM

## 2021-01-27 NOTE — Patient Instructions (Signed)
Medication Instructions:  Your physician recommends that you continue on your current medications as directed. Please refer to the Current Medication list given to you today.  *If you need a refill on your cardiac medications before your next appointment, please call your pharmacy*   Lab Work: Your physician recommends that you return for lab work in: TODAY BMP, ProBNP If you have labs (blood work) drawn today and your tests are completely normal, you will receive your results only by: . MyChart Message (if you have MyChart) OR . A paper copy in the mail If you have any lab test that is abnormal or we need to change your treatment, we will call you to review the results.   Testing/Procedures: None   Follow-Up: At [facility], you and your health needs are our priority.  As part of our continuing mission to provide you with exceptional heart care, we have created designated Provider Care Teams.  These Care Teams include your primary Cardiologist (physician) and Advanced Practice Providers (APPs -  Physician Assistants and Nurse Practitioners) who all work together to provide you with the care you need, when you need it.  We recommend signing up for the patient portal called "MyChart".  Sign up information is provided on this After Visit Summary.  MyChart is used to connect with patients for Virtual Visits (Telemedicine).  Patients are able to view lab/test results, encounter notes, upcoming appointments, etc.  Non-urgent messages can be sent to your provider as well.   To learn more about what you can do with MyChart, go to [email or web address].    Your next appointment:   6 month(s)  The format for your next appointment:   In Person  Provider:   Brian Munley, MD   Other Instructions   

## 2021-01-28 ENCOUNTER — Telehealth: Payer: Self-pay

## 2021-01-28 DIAGNOSIS — I110 Hypertensive heart disease with heart failure: Secondary | ICD-10-CM

## 2021-01-28 DIAGNOSIS — I420 Dilated cardiomyopathy: Secondary | ICD-10-CM

## 2021-01-28 DIAGNOSIS — I5022 Chronic systolic (congestive) heart failure: Secondary | ICD-10-CM

## 2021-01-28 LAB — BASIC METABOLIC PANEL
BUN/Creatinine Ratio: 22 (ref 10–24)
BUN: 59 mg/dL — ABNORMAL HIGH (ref 8–27)
CO2: 20 mmol/L (ref 20–29)
Calcium: 9.9 mg/dL (ref 8.6–10.2)
Chloride: 98 mmol/L (ref 96–106)
Creatinine, Ser: 2.69 mg/dL — ABNORMAL HIGH (ref 0.76–1.27)
Glucose: 120 mg/dL — ABNORMAL HIGH (ref 65–99)
Potassium: 5.1 mmol/L (ref 3.5–5.2)
Sodium: 139 mmol/L (ref 134–144)
eGFR: 26 mL/min/{1.73_m2} — ABNORMAL LOW (ref >59)

## 2021-01-28 LAB — PRO B NATRIURETIC PEPTIDE: NT-Pro BNP: 780 pg/mL — ABNORMAL HIGH (ref 0–210)

## 2021-01-28 MED ORDER — FUROSEMIDE 20 MG PO TABS: 20.0000 mg | ORAL_TABLET | Freq: Every day | ORAL | 3 refills | Status: DC

## 2021-01-28 NOTE — Telephone Encounter (Signed)
Spoke with patient regarding results and recommendation.  Patient verbalizes understanding and is agreeable to plan of care. Advised patient to call back with any issues or concerns.  

## 2021-01-28 NOTE — Telephone Encounter (Signed)
-----   Message from Baldo Daub, MD sent at 01/28/2021  7:39 AM EDT ----- Worsened kidney function/ reduce furosemide to 20 mg day recheck BMP 1 week

## 2021-02-04 ENCOUNTER — Other Ambulatory Visit: Payer: Self-pay

## 2021-02-04 DIAGNOSIS — I420 Dilated cardiomyopathy: Secondary | ICD-10-CM

## 2021-02-04 DIAGNOSIS — I5022 Chronic systolic (congestive) heart failure: Secondary | ICD-10-CM

## 2021-02-04 DIAGNOSIS — I110 Hypertensive heart disease with heart failure: Secondary | ICD-10-CM

## 2021-02-05 ENCOUNTER — Telehealth: Payer: Self-pay

## 2021-02-05 DIAGNOSIS — I10 Essential (primary) hypertension: Secondary | ICD-10-CM

## 2021-02-05 LAB — BASIC METABOLIC PANEL
BUN/Creatinine Ratio: 21 (ref 10–24)
BUN: 37 mg/dL — ABNORMAL HIGH (ref 8–27)
CO2: 21 mmol/L (ref 20–29)
Calcium: 9.4 mg/dL (ref 8.6–10.2)
Chloride: 104 mmol/L (ref 96–106)
Creatinine, Ser: 1.8 mg/dL — ABNORMAL HIGH (ref 0.76–1.27)
Glucose: 109 mg/dL — ABNORMAL HIGH (ref 65–99)
Potassium: 5.1 mmol/L (ref 3.5–5.2)
Sodium: 141 mmol/L (ref 134–144)
eGFR: 42 mL/min/{1.73_m2} — ABNORMAL LOW (ref >59)

## 2021-02-05 MED ORDER — FUROSEMIDE 40 MG PO TABS
40.0000 mg | ORAL_TABLET | Freq: Every day | ORAL | 3 refills | Status: DC
Start: 2021-02-05 — End: 2022-01-14

## 2021-02-05 NOTE — Telephone Encounter (Signed)
Spoke with patient regarding results and recommendation.  Patient verbalizes understanding and is agreeable to plan of care. Advised patient to call back with any issues or concerns.  

## 2021-02-05 NOTE — Telephone Encounter (Signed)
-----   Message from Baldo Daub, MD sent at 02/05/2021  7:55 AM EDT ----- Improved resume furosemide 40 mg daily recheck BMP in 2 weeks

## 2021-02-16 ENCOUNTER — Other Ambulatory Visit: Payer: Self-pay | Admitting: Cardiology

## 2021-02-16 NOTE — Telephone Encounter (Signed)
Atorvastatin approved and sent 

## 2021-02-26 ENCOUNTER — Ambulatory Visit (INDEPENDENT_AMBULATORY_CARE_PROVIDER_SITE_OTHER): Payer: 59

## 2021-02-26 ENCOUNTER — Telehealth: Payer: Self-pay | Admitting: Emergency Medicine

## 2021-02-26 DIAGNOSIS — I420 Dilated cardiomyopathy: Secondary | ICD-10-CM | POA: Diagnosis not present

## 2021-02-26 LAB — CUP PACEART REMOTE DEVICE CHECK
Battery Remaining Longevity: 133 mo
Battery Voltage: 3.07 V
Brady Statistic RV Percent Paced: 0.01 %
Date Time Interrogation Session: 20220414033523
HighPow Impedance: 80 Ohm
Implantable Lead Implant Date: 20210714
Implantable Lead Location: 753860
Implantable Pulse Generator Implant Date: 20210714
Lead Channel Impedance Value: 570 Ohm
Lead Channel Impedance Value: 665 Ohm
Lead Channel Pacing Threshold Amplitude: 0.625 V
Lead Channel Pacing Threshold Pulse Width: 0.4 ms
Lead Channel Sensing Intrinsic Amplitude: 12.25 mV
Lead Channel Sensing Intrinsic Amplitude: 12.25 mV
Lead Channel Setting Pacing Amplitude: 2 V
Lead Channel Setting Pacing Pulse Width: 0.4 ms
Lead Channel Setting Sensing Sensitivity: 0.3 mV

## 2021-02-26 NOTE — Telephone Encounter (Signed)
Carelink alert received for elevated OptiVol reading received on 02/26/21. Patient states no issues with edema. Patient does state some shortness of breath from time to time. Patient does state compliance with medications and his 40 mg Lasix. Routing to Dr. Elberta Fortis to make aware.

## 2021-02-27 NOTE — Telephone Encounter (Addendum)
Spoke with patient 02/27/21. Per Dr. Elberta Fortis patient instructed to take 40 mg of lasix BID x 2 days. Patient verbalized understanding and stated that he will comply with instructions. Discussed low sodium diet with patient as well.

## 2021-03-12 NOTE — Progress Notes (Signed)
Remote ICD transmission.   

## 2021-05-28 ENCOUNTER — Ambulatory Visit (INDEPENDENT_AMBULATORY_CARE_PROVIDER_SITE_OTHER): Payer: 59

## 2021-05-28 DIAGNOSIS — I420 Dilated cardiomyopathy: Secondary | ICD-10-CM | POA: Diagnosis not present

## 2021-05-28 LAB — CUP PACEART REMOTE DEVICE CHECK
Battery Remaining Longevity: 132 mo
Battery Voltage: 3.06 V
Brady Statistic RV Percent Paced: 0.03 %
Date Time Interrogation Session: 20220714022827
HighPow Impedance: 90 Ohm
Implantable Lead Implant Date: 20210714
Implantable Lead Location: 753860
Implantable Pulse Generator Implant Date: 20210714
Lead Channel Impedance Value: 627 Ohm
Lead Channel Impedance Value: 665 Ohm
Lead Channel Pacing Threshold Amplitude: 0.625 V
Lead Channel Pacing Threshold Pulse Width: 0.4 ms
Lead Channel Sensing Intrinsic Amplitude: 11.125 mV
Lead Channel Sensing Intrinsic Amplitude: 11.125 mV
Lead Channel Setting Pacing Amplitude: 2 V
Lead Channel Setting Pacing Pulse Width: 0.4 ms
Lead Channel Setting Sensing Sensitivity: 0.3 mV

## 2021-06-19 NOTE — Progress Notes (Signed)
Remote ICD transmission.   

## 2021-07-30 ENCOUNTER — Encounter: Payer: Self-pay | Admitting: Cardiology

## 2021-07-30 ENCOUNTER — Ambulatory Visit (INDEPENDENT_AMBULATORY_CARE_PROVIDER_SITE_OTHER): Payer: 59 | Admitting: Cardiology

## 2021-07-30 ENCOUNTER — Other Ambulatory Visit: Payer: Self-pay

## 2021-07-30 VITALS — BP 132/78 | HR 78 | Ht 70.0 in | Wt 175.0 lb

## 2021-07-30 DIAGNOSIS — Z95810 Presence of automatic (implantable) cardiac defibrillator: Secondary | ICD-10-CM

## 2021-07-30 DIAGNOSIS — I420 Dilated cardiomyopathy: Secondary | ICD-10-CM | POA: Diagnosis not present

## 2021-07-30 DIAGNOSIS — I472 Ventricular tachycardia, unspecified: Secondary | ICD-10-CM

## 2021-07-30 DIAGNOSIS — N182 Chronic kidney disease, stage 2 (mild): Secondary | ICD-10-CM

## 2021-07-30 DIAGNOSIS — I110 Hypertensive heart disease with heart failure: Secondary | ICD-10-CM | POA: Diagnosis not present

## 2021-07-30 MED ORDER — DAPAGLIFLOZIN PROPANEDIOL 10 MG PO TABS
10.0000 mg | ORAL_TABLET | Freq: Every day | ORAL | 3 refills | Status: DC
Start: 2021-07-30 — End: 2022-08-17

## 2021-07-30 MED ORDER — ALBUTEROL SULFATE HFA 108 (90 BASE) MCG/ACT IN AERS
2.0000 | INHALATION_SPRAY | Freq: Four times a day (QID) | RESPIRATORY_TRACT | 2 refills | Status: DC | PRN
Start: 2021-07-30 — End: 2022-08-18

## 2021-07-30 NOTE — Addendum Note (Signed)
Addended by: Delorse Limber I on: 07/30/2021 09:20 AM   Modules accepted: Orders

## 2021-07-30 NOTE — Patient Instructions (Signed)
Medication Instructions:  Your physician has recommended you make the following change in your medication:  START: Farxiga 10 mg take one tablet by mouth daily.  START: Albuterol 2 puffs every 6 hours as needed.  *If you need a refill on your cardiac medications before your next appointment, please call your pharmacy*   Lab Work: Your physician recommends that you return for lab work in: TODAY CMP, Lipids If you have labs (blood work) drawn today and your tests are completely normal, you will receive your results only by: MyChart Message (if you have MyChart) OR A paper copy in the mail If you have any lab test that is abnormal or we need to change your treatment, we will call you to review the results.   Testing/Procedures: None   Follow-Up: At [facility], you and your health needs are our priority.  As part of our continuing mission to provide you with exceptional heart care, we have created designated Provider Care Teams.  These Care Teams include your primary Cardiologist (physician) and Advanced Practice Providers (APPs -  Physician Assistants and Nurse Practitioners) who all work together to provide you with the care you need, when you need it.  We recommend signing up for the patient portal called "MyChart".  Sign up information is provided on this After Visit Summary.  MyChart is used to connect with patients for Virtual Visits (Telemedicine).  Patients are able to view lab/test results, encounter notes, upcoming appointments, etc.  Non-urgent messages can be sent to your provider as well.   To learn more about what you can do with MyChart, go to [email or web address].    Your next appointment:   6 month(s)  The format for your next appointment:   In Person  Provider:   Norman Herrlich, MD   Other Instructions

## 2021-07-30 NOTE — Progress Notes (Signed)
Cardiology Office Note:    Date:  07/30/2021   ID:  Kenneth Pham, DOB [birth year], MRN [id number]  PCP:  Olive Bass, MD  Cardiologist:  Norman Herrlich, MD    Referring MD: Olive Bass, MD    ASSESSMENT:    1. Hypertensive heart disease with heart failure (HCC)   2. Dilated cardiomyopathy (HCC)   3. ICD (implantable cardioverter-defibrillator) in place   4. VT (ventricular tachycardia) (HCC)   5. CKD (chronic kidney disease) stage 2, GFR 60-89 ml/min    PLAN:    In order of problems listed above:  Kenneth Pham continues to do well with his severe cardiomyopathy and heart failure that is compensated we will continue loop diuretic high-dose Entresto and maximally tolerated beta-blocker I would not uptitrate carvedilol with his pulmonary disease and bronchospasm and he was given an inhaler prescription we will check labs today including renal function with CKD proBNP level and lipid profile.  Ultimately he will qualify for pharmacy benefits for SGLT2 inhibitor Stable VT ICD: Device clinic and he had frequent episodes and required VT VF therapy antiarrhythmic drug treatment amiodarone for VT ablation would be appropriate   Next appointment: 6 months   Medication Adjustments/Labs and Tests Ordered: Current medicines are reviewed at length with the patient today.  Concerns regarding medicines are outlined above.  No orders of the defined types were placed in this encounter.  No orders of the defined types were placed in this encounter.   Chief Complaint  Patient presents with   Follow-up   Cardiomyopathy   Congestive Heart Failure    History of Present Illness:    Kenneth Pham is a 65 y.o. male with a hx of chronic systolic heart failure with hypertensive heart disease dilated cardiomyopathy most recent ejection fraction 30 to 35% January 2021 he has an ICD and stage II CKD last seen 01/27/2021.  Compliance with diet, lifestyle and medications: Yes  He continues to do well  recently has had trouble with cough and wheezing and he has not been walking as much he attributes to the air quality.  He has used inhaler in the past and I will give him an albuterol rescue inhaler that he can use. He is pleased with the quality of his life he has no edema orthopnea chest pain palpitations syncope and is able to function but is short of breath when he walks a longer distance and outdoors. Will go to see if he qualifies to receive a prescription for an SGLT2 inhibitor.  He is 65 years old and we may need to wait until he has Medicare benefits. He tolerates his statin without muscle pain or weakness  His last ICD remote check is from 05/28/2021 showed normal device and lead parameters he had 3 episodes of nonsustained tachycardia but did not receive any VT VF therapy he is not being paced battery life is projected and 132 months. Past Medical History:  Diagnosis Date   Acute CHF (HCC) 05/16/2019   Alcohol dependence in remission (HCC) 05/16/2019   Arteriosclerosis of carotid artery, right 05/15/2019   Cerebellar stroke, acute (HCC) 05/09/2019   Chronic anticoagulation 07/13/2019   CKD (chronic kidney disease) stage 2, GFR 60-89 ml/min 05/29/2019   Dilated cardiomyopathy (HCC) 05/29/2019   Heart failure with reduced ejection fraction (HCC) 05/09/2019   Hypercholesteremia    Hypertension    Hypertensive heart disease with heart failure (HCC) 05/29/2019   Precordial chest pain 08/21/2019   Pulmonary nodules 05/15/2019   05/01/2019 CT  angio - 8.20mm bandlike nodular density lingula - 5.55mm R middle lobe - 61.5 pack year hx, rec 3 month f/u   Shortness of breath 08/21/2019   Snores 05/29/2019    Past Surgical History:  Procedure Laterality Date   APPENDECTOMY     CATARACT EXTRACTION Right    ICD IMPLANT N/A 05/28/2020   Procedure: ICD IMPLANT;  Surgeon: Regan Lemming, MD;  Location: [facility];  Service: Cardiovascular;  Laterality: N/A;   TONSILLECTOMY      Current  Medications: Current Meds  Medication Sig   aspirin EC 81 MG tablet Take 81 mg by mouth daily.    atorvastatin (LIPITOR) 80 MG tablet TAKE 1 TABLET (80 MG TOTAL) BY MOUTH DAILY. CHOLESTEROL   carvedilol (COREG) 12.5 MG tablet Take 1 tablet (12.5 mg total) by mouth 2 (two) times daily.   ENTRESTO 97-103 MG TAKE 1 TABLET BY MOUTH TWICE A DAY   furosemide (LASIX) 40 MG tablet Take 1 tablet (40 mg total) by mouth daily.   prednisoLONE acetate (PRED FORTE) 1 % ophthalmic suspension Place 1 drop into the right eye 3 (three) times daily.     Allergies:   Patient has no known allergies.   Social History   Socioeconomic History   Marital status: Widowed    Spouse name: Not on file   Number of children: Not on file   Years of education: Not on file   Highest education level: Not on file  Occupational History   Not on file  Tobacco Use   Smoking status: Former    Types: Cigarettes    Quit date: 04/29/2019    Years since quitting: 2.2   Smokeless tobacco: Never  Vaping Use   Vaping Use: Never used  Substance and Sexual Activity   Alcohol use: Not Currently   Drug use: Not Currently    Types: Marijuana   Sexual activity: Yes  Other Topics Concern   Not on file  Social History Narrative   Not on file   Social Determinants of Health   Financial Resource Strain: Not on file  Food Insecurity: Not on file  Transportation Needs: Not on file  Physical Activity: Not on file  Stress: Not on file  Social Connections: Not on file     Family History: The patient's family history includes Cancer in his father; Heart Problems in his brother; Stroke in his mother. ROS:   Please see the history of present illness.    All other systems reviewed and are negative.  EKGs/Labs/Other Studies Reviewed:    The following studies were reviewed today:    Recent Labs: 10/29/2020: ALT 18 01/27/2021: NT-Pro BNP 780 02/04/2021: BUN 37; Creatinine, Ser 1.80; Potassium 5.1; Sodium 141  Recent Lipid  Panel    Component Value Date/Time   CHOL 139 10/29/2020 1032   TRIG 100 10/29/2020 1032   HDL 42 10/29/2020 1032   CHOLHDL 3.3 10/29/2020 1032   LDLCALC 78 10/29/2020 1032    Physical Exam:    VS:  BP 132/78 (BP Location: Right Arm, Patient Position: Sitting, Cuff Size: Normal)   Pulse 78   Ht 5\' 10"  (1.778 m)   Wt 175 lb (79.4 kg)   SpO2 99%   BMI 25.11 kg/m     Wt Readings from Last 3 Encounters:  07/30/21 175 lb (79.4 kg)  01/27/21 182 lb 9.6 oz (82.8 kg)  10/29/20 189 lb 9.6 oz (86 kg)     GEN:  Well nourished, well  developed in no acute distress HEENT: Normal NECK: No JVD; No carotid bruits LYMPHATICS: No lymphadenopathy CARDIAC: Barrel chested distant heart sounds diminished breath sounds he is not wheezing today RRR, no murmurs, rubs, gallops RESPIRATORY:  Clear to auscultation without rales, wheezing or rhonchi  ABDOMEN: Soft, non-tender, non-distended MUSCULOSKELETAL:  No edema; No deformity  SKIN: Warm and dry NEUROLOGIC:  Alert and oriented x 3 PSYCHIATRIC:  Normal affect    Signed, Norman Herrlich, MD  07/30/2021 9:10 AM    [facility]

## 2021-07-31 LAB — COMPREHENSIVE METABOLIC PANEL
ALT: 13 IU/L (ref 0–44)
AST: 16 IU/L (ref 0–40)
Albumin/Globulin Ratio: 1.9 (ref 1.2–2.2)
Albumin: 4.7 g/dL (ref 3.8–4.8)
Alkaline Phosphatase: 104 IU/L (ref 44–121)
BUN/Creatinine Ratio: 17 (ref 10–24)
BUN: 26 mg/dL (ref 8–27)
Bilirubin Total: 0.4 mg/dL (ref 0.0–1.2)
CO2: 19 mmol/L — ABNORMAL LOW (ref 20–29)
Calcium: 10.1 mg/dL (ref 8.6–10.2)
Chloride: 101 mmol/L (ref 96–106)
Creatinine, Ser: 1.56 mg/dL — ABNORMAL HIGH (ref 0.76–1.27)
Globulin, Total: 2.5 g/dL (ref 1.5–4.5)
Glucose: 107 mg/dL — ABNORMAL HIGH (ref 65–99)
Potassium: 5.7 mmol/L — ABNORMAL HIGH (ref 3.5–5.2)
Sodium: 138 mmol/L (ref 134–144)
Total Protein: 7.2 g/dL (ref 6.0–8.5)
eGFR: 49 mL/min/{1.73_m2} — ABNORMAL LOW (ref >59)

## 2021-07-31 LAB — LIPID PANEL
Chol/HDL Ratio: 3.7 ratio (ref 0.0–5.0)
Cholesterol, Total: 146 mg/dL (ref 100–199)
HDL: 39 mg/dL — ABNORMAL LOW (ref >39)
LDL Chol Calc (NIH): 83 mg/dL (ref 0–99)
Triglycerides: 135 mg/dL (ref 0–149)
VLDL Cholesterol Cal: 24 mg/dL (ref 5–40)

## 2021-08-03 ENCOUNTER — Telehealth: Payer: Self-pay

## 2021-08-03 DIAGNOSIS — I110 Hypertensive heart disease with heart failure: Secondary | ICD-10-CM

## 2021-08-03 NOTE — Telephone Encounter (Signed)
-----   Message from Thomasene Ripple, DO sent at 08/02/2021  7:36 PM EDT ----- This is Dr. Hulen Shouts patient: Creatinine stable, potassium is elevated 5.7 compared to prior I would recommend not eating calcium foods for the next several days.  Repeat lab in 1 week.  To follow-up with potassium.

## 2021-08-03 NOTE — Telephone Encounter (Signed)
Spoke with patient regarding results and recommendation.  Patient verbalizes understanding and is agreeable to plan of care. Advised patient to call back with any issues or concerns.  

## 2021-08-12 ENCOUNTER — Other Ambulatory Visit: Payer: Self-pay

## 2021-08-12 DIAGNOSIS — I110 Hypertensive heart disease with heart failure: Secondary | ICD-10-CM

## 2021-08-13 ENCOUNTER — Other Ambulatory Visit: Payer: Self-pay

## 2021-08-13 DIAGNOSIS — Z79899 Other long term (current) drug therapy: Secondary | ICD-10-CM

## 2021-08-13 LAB — BASIC METABOLIC PANEL
BUN/Creatinine Ratio: 18 (ref 10–24)
BUN: 31 mg/dL — ABNORMAL HIGH (ref 8–27)
CO2: 18 mmol/L — ABNORMAL LOW (ref 20–29)
Calcium: 9.5 mg/dL (ref 8.6–10.2)
Chloride: 101 mmol/L (ref 96–106)
Creatinine, Ser: 1.75 mg/dL — ABNORMAL HIGH (ref 0.76–1.27)
Glucose: 101 mg/dL — ABNORMAL HIGH (ref 70–99)
Potassium: 6 mmol/L — ABNORMAL HIGH (ref 3.5–5.2)
Sodium: 138 mmol/L (ref 134–144)
eGFR: 43 mL/min/{1.73_m2} — ABNORMAL LOW (ref >59)

## 2021-08-18 ENCOUNTER — Other Ambulatory Visit: Payer: Self-pay

## 2021-08-18 DIAGNOSIS — Z79899 Other long term (current) drug therapy: Secondary | ICD-10-CM

## 2021-08-18 LAB — BASIC METABOLIC PANEL
BUN/Creatinine Ratio: 17 (ref 10–24)
BUN: 33 mg/dL — ABNORMAL HIGH (ref 8–27)
CO2: 19 mmol/L — ABNORMAL LOW (ref 20–29)
Calcium: 9.7 mg/dL (ref 8.6–10.2)
Chloride: 101 mmol/L (ref 96–106)
Creatinine, Ser: 1.89 mg/dL — ABNORMAL HIGH (ref 0.76–1.27)
Glucose: 107 mg/dL — ABNORMAL HIGH (ref 70–99)
Potassium: 5.2 mmol/L (ref 3.5–5.2)
Sodium: 137 mmol/L (ref 134–144)
eGFR: 39 mL/min/{1.73_m2} — ABNORMAL LOW (ref >59)

## 2021-08-27 ENCOUNTER — Ambulatory Visit (INDEPENDENT_AMBULATORY_CARE_PROVIDER_SITE_OTHER): Payer: 59

## 2021-08-27 DIAGNOSIS — I420 Dilated cardiomyopathy: Secondary | ICD-10-CM | POA: Diagnosis not present

## 2021-08-27 LAB — CUP PACEART REMOTE DEVICE CHECK
Battery Remaining Longevity: 131 mo
Battery Voltage: 3.05 V
Brady Statistic RV Percent Paced: 0.03 %
Date Time Interrogation Session: 20221013022602
HighPow Impedance: 82 Ohm
Implantable Lead Implant Date: 20210714
Implantable Lead Location: 753860
Implantable Pulse Generator Implant Date: 20210714
Lead Channel Impedance Value: 570 Ohm
Lead Channel Impedance Value: 646 Ohm
Lead Channel Pacing Threshold Amplitude: 0.625 V
Lead Channel Pacing Threshold Pulse Width: 0.4 ms
Lead Channel Sensing Intrinsic Amplitude: 10.625 mV
Lead Channel Sensing Intrinsic Amplitude: 10.625 mV
Lead Channel Setting Pacing Amplitude: 2 V
Lead Channel Setting Pacing Pulse Width: 0.4 ms
Lead Channel Setting Sensing Sensitivity: 0.3 mV

## 2021-09-04 NOTE — Progress Notes (Signed)
Remote ICD transmission.   

## 2021-09-19 ENCOUNTER — Other Ambulatory Visit: Payer: Self-pay | Admitting: Cardiology

## 2021-09-21 ENCOUNTER — Other Ambulatory Visit: Payer: Self-pay | Admitting: Cardiology

## 2021-09-22 ENCOUNTER — Other Ambulatory Visit: Payer: Self-pay | Admitting: Cardiology

## 2021-10-16 DIAGNOSIS — B9789 Other viral agents as the cause of diseases classified elsewhere: Secondary | ICD-10-CM | POA: Insufficient documentation

## 2021-10-16 HISTORY — DX: Other viral agents as the cause of diseases classified elsewhere: B97.89

## 2021-11-22 NOTE — Progress Notes (Signed)
Electrophysiology Office Note   Date:  11/23/2021   ID:  Kenneth Pham, DOB May 25, 1956, MRN [id number]  PCP:  Olive Bass, MD  Cardiologist:  Dulce Sellar  Primary Electrophysiologist:  Linus Weckerly [name], MD    Chief Complaint: CHF, PVCs   History of Present Illness: Kenneth Pham is a 66 y.o. male who is being seen today for the evaluation of CHF at the request of [name], [name], MD. Presenting today for electrophysiology evaluation.  He has a history significant for chronic systolic heart failure due to dilated cardiomyopathy, hypertension, hyperlipidemia, CVA, alcohol abuse in remission, stage II CKD.  He has NYHA class II symptoms.  He is now status post Medtronic ICD implanted 05/28/2020.  Today, denies symptoms of palpitations, chest pain, shortness of breath, orthopnea, PND, lower extremity edema, claudication, dizziness, presyncope, syncope, bleeding, or neurologic sequela. The patient is tolerating medications without difficulties.  He has been feeling well.  He has had no chest pain or shortness of breath.  Is able do all of his daily activities.  He is overall happy with his control.  On device interrogation, he did have an episode of nonsustained ventricular tachycardia lasting up to 20 seconds.  Past Medical History:  Diagnosis Date   Acute CHF (HCC) 05/16/2019   Alcohol dependence in remission (HCC) 05/16/2019   Arteriosclerosis of carotid artery, right 05/15/2019   Cerebellar stroke, acute (HCC) 05/09/2019   Chronic anticoagulation 07/13/2019   CKD (chronic kidney disease) stage 2, GFR 60-89 ml/min 05/29/2019   Dilated cardiomyopathy (HCC) 05/29/2019   Heart failure with reduced ejection fraction (HCC) 05/09/2019   Hypercholesteremia    Hypertension    Hypertensive heart disease with heart failure (HCC) 05/29/2019   Precordial chest pain 08/21/2019   Pulmonary nodules 05/15/2019   05/01/2019 CT angio - 8.4mm bandlike nodular density lingula - 5.75mm R middle lobe - 61.5 pack year  hx, rec 3 month f/u   Shortness of breath 08/21/2019   Snores 05/29/2019   Past Surgical History:  Procedure Laterality Date   APPENDECTOMY     CATARACT EXTRACTION Right    ICD IMPLANT N/A 05/28/2020   Procedure: ICD IMPLANT;  Surgeon: Regan Lemming, MD;  Location: [facility];  Service: Cardiovascular;  Laterality: N/A;   TONSILLECTOMY       Current Outpatient Medications  Medication Sig Dispense Refill   albuterol (VENTOLIN HFA) 108 (90 Base) MCG/ACT inhaler Inhale 2 puffs into the lungs every 6 (six) hours as needed for wheezing or shortness of breath. 8 g 2   aspirin EC 81 MG tablet Take 81 mg by mouth daily.      atorvastatin (LIPITOR) 80 MG tablet TAKE 1 TABLET (80 MG TOTAL) BY MOUTH DAILY. CHOLESTEROL 90 tablet 2   carvedilol (COREG) 12.5 MG tablet TAKE 1 TABLET BY MOUTH 2 TIMES DAILY. 180 tablet 3   dapagliflozin propanediol (FARXIGA) 10 MG TABS tablet Take 1 tablet (10 mg total) by mouth daily before breakfast. 90 tablet 3   ENTRESTO 97-103 MG TAKE 1 TABLET BY MOUTH TWICE A DAY 180 tablet 3   prednisoLONE acetate (PRED FORTE) 1 % ophthalmic suspension Place 1 drop into the right eye 3 (three) times daily.     furosemide (LASIX) 40 MG tablet Take 1 tablet (40 mg total) by mouth daily. 90 tablet 3   No current facility-administered medications for this visit.    Allergies:   Patient has no known allergies.   Social History:  The patient  reports that he quit smoking about 2 years ago. His smoking use included cigarettes. He has never used smokeless tobacco. He reports that he does not currently use alcohol. He reports that he does not currently use drugs after having used the following drugs: Marijuana.   Family History:  The patient's family history includes Cancer in his father; Heart Problems in his brother; Stroke in his mother.   ROS:  Please see the history of present illness.   Otherwise, review of systems is positive for none.   All other systems are  reviewed and negative.   PHYSICAL EXAM: VS:  BP 110/64    Pulse 71    Ht 5\' 10"  (1.778 m)    Wt 172 lb 9.6 oz (78.3 kg)    SpO2 90%    BMI 24.77 kg/m  , BMI Body mass index is 24.77 kg/m. GEN: Well nourished, well developed, in no acute distress  HEENT: normal  Neck: no JVD, carotid bruits, or masses Cardiac: RRR; no murmurs, rubs, or gallops,no edema  Respiratory:  clear to auscultation bilaterally, normal work of breathing GI: soft, nontender, nondistended, + BS MS: no deformity or atrophy  Skin: warm and dry, device site well healed Neuro:  Strength and sensation are intact Psych: euthymic mood, full affect  EKG:  EKG is ordered today. Personal review of the ekg ordered shows sinus rhythm, rate 71  Personal review of the device interrogation today. Results in [place]: 01/27/2021: NT-Pro BNP 780 07/30/2021: ALT 13 08/18/2021: BUN 33; Creatinine, Ser 1.89; Potassium 5.2; Sodium 137    Lipid Panel     Component Value Date/Time   CHOL 146 07/30/2021 0935   TRIG 135 07/30/2021 0935   HDL 39 (L) 07/30/2021 0935   CHOLHDL 3.7 07/30/2021 0935   LDLCALC 83 07/30/2021 0935     Wt Readings from Last 3 Encounters:  11/23/21 172 lb 9.6 oz (78.3 kg)  07/30/21 175 lb (79.4 kg)  01/27/21 182 lb 9.6 oz (82.8 kg)      Other studies Reviewed: Additional studies/ records that were reviewed today include: TTE 08/02/19  Review of the above records today demonstrates:   1. The left ventricle has moderate-severely reduced systolic function,  with an ejection fraction of 30-35%. There is diffuse hypokinesis of the  Left ventricle. There is mild concentric left ventricular hypertrophy.  Left ventricular diastolic Doppler  parameters are consistent with pseudonormalization. Elevated mean left  atrial pressure.   2. There is abnormal global logitudinal strain (-7.5%).   3. The right ventricle has normal systolic function.   4. Left atrial and Right atrial size was normal.    5. Mild thickening of the aortic valve. Mild calcification of the aortic  valve. Aortic valve regurgitation was not visualized by color flow  Doppler.   Monitor 02/19/20 personally  Reviewed The rhythm throughout was sinus with minimum average and maximum heart rates of 51, 81 and 128 bpm. Ventricular ectopy was occasional PVCs and rare couplets and triplets.  There were 11 runs of PVCs the fastest 12 complexes at a rate of 163 bpm and the longest 16.2 seconds at a relatively slow rate of 113 bpm.    ASSESSMENT AND PLAN:  1.  Chronic systolic heart failure due to dilated cardiomyopathy: NYHA class II symptoms.  Currently on Entresto and carvedilol.  Is now status post Medtronic ICD implanted 05/28/2020.  Device functioning appropriately.  2.  Hypertension: Currently well controlled  3.  Hyperlipidemia: Continue statin  per primary cardiology.  4.  Nonsustained ventricular tachycardia: Noted on device interrogation.  Episode lasted approximately 20 seconds.  Patient did not receive ICD therapy.  Keldan Eplin increase carvedilol to 25 mg twice daily.  Current medicines are reviewed at length with the patient today.   The patient does not have concerns regarding his medicines.  The following changes were made today: Increase carvedilol  Labs/ tests ordered today include:  Orders Placed This Encounter  Procedures   EKG 12-Lead     Disposition:   FU with Issabela Lesko 12 months  Signed, Berel Najjar Meredith Leeds, MD  11/23/2021 9:16 AM     [facility] [address] [place] [address] [phone] (office) [phone] (fax)

## 2021-11-23 ENCOUNTER — Ambulatory Visit (INDEPENDENT_AMBULATORY_CARE_PROVIDER_SITE_OTHER): Payer: Medicare Other | Admitting: Cardiology

## 2021-11-23 ENCOUNTER — Encounter: Payer: Self-pay | Admitting: Cardiology

## 2021-11-23 ENCOUNTER — Other Ambulatory Visit: Payer: Self-pay

## 2021-11-23 DIAGNOSIS — I420 Dilated cardiomyopathy: Secondary | ICD-10-CM | POA: Diagnosis not present

## 2021-11-23 MED ORDER — CARVEDILOL 25 MG PO TABS: 25.0000 mg | ORAL_TABLET | Freq: Two times a day (BID) | ORAL | 3 refills | Status: DC

## 2021-11-23 NOTE — Patient Instructions (Addendum)
Medication Instructions:  Your physician has recommended you make the following change in your medication:  INCREASE Carvedilol to 25 mg twice daily  *If you need a refill on your cardiac medications before your next appointment, please call your pharmacy*   Lab Work: None ordered   Testing/Procedures: None ordered   Follow-Up: At [facility], you and your health needs are our priority.  As part of our continuing mission to provide you with exceptional heart care, we have created designated Provider Care Teams.  These Care Teams include your primary Cardiologist (physician) and Advanced Practice Providers (APPs -  Physician Assistants and Nurse Practitioners) who all work together to provide you with the care you need, when you need it.  Remote monitoring is used to monitor your Pacemaker or ICD from home. This monitoring reduces the number of office visits required to check your device to one time per year. It allows [place] to keep an eye on the functioning of your device to ensure it is working properly. You are scheduled for a device check from home on 02/25/2022. You may send your transmission at any time that day. If you have a wireless device, the transmission will be sent automatically. After your physician reviews your transmission, you will receive a postcard with your next transmission date.  Your next appointment:   1 year(s)  The format for your next appointment:   In Person  Provider:   Allegra Lai, MD   Thank you for choosing [facility]!!   Trinidad Curet, RN [phone]

## 2021-12-26 ENCOUNTER — Other Ambulatory Visit: Payer: Self-pay | Admitting: Cardiology

## 2021-12-28 NOTE — Telephone Encounter (Signed)
°*  STAT* If patient is at the pharmacy, call can be transferred to refill team.   1. Which medications need to be refilled? (please list name of each medication and dose if known) ENTRESTO 97-103 MG  2. Which pharmacy/location (including street and city if local pharmacy) is medication to be sent to? [id number] - [place], [place] - [address]  3. Do they need a 30 day or 90 day supply? 30 day

## 2022-01-14 ENCOUNTER — Other Ambulatory Visit: Payer: Self-pay | Admitting: Cardiology

## 2022-01-31 NOTE — Progress Notes (Signed)
?Cardiology Office Note:   ? ?Date:  02/01/2022  ? ?ID:  Kenneth Pham, DOB July 17, 1956, MRN [id number] ? ?PCP:  Algis Greenhouse, MD  ?Cardiologist:  Shirlee More, MD   ? ?Referring MD: Algis Greenhouse, MD  ? ? ?ASSESSMENT:   ? ?1. Chronic systolic heart failure ([place])   ?2. Hypertensive heart disease with heart failure ([place])   ?3. CKD (chronic kidney disease) stage 2, GFR 60-89 ml/min   ?4. VT (ventricular tachycardia)   ?5. ICD (implantable cardioverter-defibrillator) in place   ?6. Hypercholesteremia   ? ?PLAN:   ? ?In order of problems listed above: ? ?He continues stable with heart failure New York Heart Association class II no volume overload on good medical therapy including loop diuretic SGLT2 inhibitor beta-blocker Entresto and is not on spironolactone with his CKD.  Continued same medications ?Recheck renal function with CKD ?He is not on antiarrhythmic drug he is not having VT on his device telemetry we will continue to follow his ICD in our device clinic ?Continue statin check CMP lipid profile ? ? ?Next appointment: 4 months ? ? ?Medication Adjustments/Labs and Tests Ordered: ?Current medicines are reviewed at length with the patient today.  Concerns regarding medicines are outlined above.  ?Orders Placed This Encounter  ?Procedures  ? Lipid Profile  ? Comp Met (CMET)  ? Pro b natriuretic peptide  ? ?No orders of the defined types were placed in this encounter. ? ? ?Complaint, follow-up for heart failure ? ? ?History of Present Illness:   ? ?Kenneth Pham is a 66 y.o. male with a hx of chronic systolic heart failure with hypertensive heart disease dilated cardiomyopathy most recent ejection fraction 30 to 35% January 2021 he has an ICD and stage II CKD  last seen 07/30/2021. ? ?Compliance with diet, lifestyle and medications: Yes ? ?Overall he is stable he remains short of breath when he uses his upper extremities has to walk fast or longer distance New York Heart Association class II ?He rarely uses his  bronchodilator and he is taken over-the-counter antihistamine for seasonal allergies ?His weight is stable at home 175 pounds he has no edema orthopnea chest pain palpitation or syncope. ?He tolerates his statin without muscle pain or weakness. ? ?His last device check 08/27/2021 showed normal device and lead parameters projected battery life 131 months no arrhythmia noted on telemetry. ?Past Medical History:  ?Diagnosis Date  ? Acute CHF ([place]) 05/16/2019  ? Alcohol dependence in remission ([place]) 05/16/2019  ? Arteriosclerosis of carotid artery, right 05/15/2019  ? Bilateral carotid artery stenosis 05/15/2019  ? Cerebellar stroke, acute ([place]) 05/09/2019  ? Chronic anticoagulation 07/13/2019  ? CKD (chronic kidney disease) stage 2, GFR 60-89 ml/min 05/29/2019  ? Dilated cardiomyopathy ([place]) 05/29/2019  ? Heart failure with reduced ejection fraction ([place]) 05/09/2019  ? History of stroke involving cerebellum 05/09/2019  ? Hypercholesteremia   ? Hypertension   ? Hypertensive heart disease with heart failure ([place]) 05/29/2019  ? Nonsustained ventricular tachycardia 06/19/2020  ? Formatting of this note might be different from the original. 2021: ICD  ? Precordial chest pain 08/21/2019  ? Prediabetes 06/08/2019  ? Formatting of this note might be different from the original. [facility] 05/29/2019: A1C 5.8%  ? Pulmonary nodules 05/15/2019  ? 05/01/2019 CT angio - 8.63m bandlike nodular density lingula - 5.384mR middle lobe - 61.5 pack year hx, rec 3 month f/u  ? Shortness of breath 08/21/2019  ? Snores 05/29/2019  ? Stage 3a chronic kidney  disease ([place]) 05/29/2019  ? Viral respiratory infection 10/16/2021  ? Formatting of this note might be different from the original. 10/16/2021: COVID negative  ? ? ?Past Surgical History:  ?Procedure Laterality Date  ? APPENDECTOMY    ? CATARACT EXTRACTION Right   ? ICD IMPLANT N/A 05/28/2020  ? Procedure: ICD IMPLANT;  Surgeon: Constance Haw, MD;  Location: [facility];  Service: Cardiovascular;   Laterality: N/A;  ? TONSILLECTOMY    ? ? ?Current Medications: ?Current Meds  ?Medication Sig  ? albuterol (VENTOLIN HFA) 108 (90 Base) MCG/ACT inhaler Inhale 2 puffs into the lungs every 6 (six) hours as needed for wheezing or shortness of breath.  ? aspirin EC 81 MG tablet Take 81 mg by mouth daily.   ? atorvastatin (LIPITOR) 80 MG tablet TAKE 1 TABLET (80 MG TOTAL) BY MOUTH DAILY. CHOLESTEROL  ? carvedilol (COREG) 25 MG tablet Take 1 tablet (25 mg total) by mouth 2 (two) times daily. Dose increase  ? dapagliflozin propanediol (FARXIGA) 10 MG TABS tablet Take 1 tablet (10 mg total) by mouth daily before breakfast.  ? ENTRESTO 97-103 MG TAKE 1 TABLET BY MOUTH TWICE A DAY  ? furosemide (LASIX) 40 MG tablet Take 1 tablet (40 mg total) by mouth daily.  ? prednisoLONE acetate (PRED FORTE) 1 % ophthalmic suspension Place 1 drop into the right eye 3 (three) times daily.  ?  ? ?Allergies:   Patient has no known allergies.  ? ?Social History  ? ?Socioeconomic History  ? Marital status: Widowed  ?  Spouse name: Not on file  ? Number of children: Not on file  ? Years of education: Not on file  ? Highest education level: Not on file  ?Occupational History  ? Not on file  ?Tobacco Use  ? Smoking status: Former  ?  Types: Cigarettes  ?  Quit date: 04/29/2019  ?  Years since quitting: 2.7  ? Smokeless tobacco: Never  ?Vaping Use  ? Vaping Use: Never used  ?Substance and Sexual Activity  ? Alcohol use: Not Currently  ? Drug use: Not Currently  ?  Types: Marijuana  ? Sexual activity: Yes  ?Other Topics Concern  ? Not on file  ?Social History Narrative  ? Not on file  ? ?Social Determinants of Health  ? ?Financial Resource Strain: Not on file  ?Food Insecurity: Not on file  ?Transportation Needs: Not on file  ?Physical Activity: Not on file  ?Stress: Not on file  ?Social Connections: Not on file  ?  ? ?Family History: ?The patient's family history includes Cancer in his father; Heart Problems in his brother; Stroke in his  mother. ?ROS:   ?Please see the history of present illness.    ?All other systems reviewed and are negative. ? ?EKGs/Labs/Other Studies Reviewed:   ? ?The following studies were reviewed today: ? ? ?Recent Labs: ?07/30/2021: ALT 13 ?08/18/2021: BUN 33; Creatinine, Ser 1.89; Potassium 5.2; Sodium 137  ?Recent Lipid Panel ?   ?Component Value Date/Time  ? CHOL 146 07/30/2021 0935  ? TRIG 135 07/30/2021 0935  ? HDL 39 (L) 07/30/2021 0935  ? CHOLHDL 3.7 07/30/2021 0935  ? [place] 83 07/30/2021 0935  ? ? ?Physical Exam:   ? ?VS:  BP 118/70   Pulse 70   Ht '5\' 10"'  (1.778 m)   Wt 175 lb (79.4 kg)   SpO2 97%   BMI 25.11 kg/m?    ? ?Wt Readings from Last 3 Encounters:  ?02/01/22  175 lb (79.4 kg)  ?11/23/21 172 lb 9.6 oz (78.3 kg)  ?07/30/21 175 lb (79.4 kg)  ?  ? ?GEN:  Well nourished, well developed in no acute distress ?HEENT: Normal ?NECK: No JVD; No carotid bruits ?LYMPHATICS: No lymphadenopathy ?CARDIAC: RRR, no murmurs, rubs, gallops heart sounds are distant ?RESPIRATORY:  Clear to auscultation without rales, wheezing or rhonchi chest is hyperinflated ?ABDOMEN: Soft, non-tender, non-distended ?MUSCULOSKELETAL:  No edema; No deformity  ?SKIN: Warm and dry ?NEUROLOGIC:  Alert and oriented x 3 ?PSYCHIATRIC:  Normal affect  ? ? ?Signed, ?Shirlee More, MD  ?02/01/2022 8:18 AM    ?[place]  ?

## 2022-02-01 ENCOUNTER — Other Ambulatory Visit: Payer: Self-pay

## 2022-02-01 ENCOUNTER — Encounter: Payer: Self-pay | Admitting: Cardiology

## 2022-02-01 ENCOUNTER — Ambulatory Visit (INDEPENDENT_AMBULATORY_CARE_PROVIDER_SITE_OTHER): Payer: Medicare Other | Admitting: Cardiology

## 2022-02-01 VITALS — BP 118/70 | HR 70 | Ht 70.0 in | Wt 175.0 lb

## 2022-02-01 DIAGNOSIS — E7800 Pure hypercholesterolemia, unspecified: Secondary | ICD-10-CM

## 2022-02-01 DIAGNOSIS — N182 Chronic kidney disease, stage 2 (mild): Secondary | ICD-10-CM | POA: Diagnosis not present

## 2022-02-01 DIAGNOSIS — Z95810 Presence of automatic (implantable) cardiac defibrillator: Secondary | ICD-10-CM

## 2022-02-01 DIAGNOSIS — I110 Hypertensive heart disease with heart failure: Secondary | ICD-10-CM | POA: Diagnosis not present

## 2022-02-01 DIAGNOSIS — I472 Ventricular tachycardia, unspecified: Secondary | ICD-10-CM

## 2022-02-01 DIAGNOSIS — I5022 Chronic systolic (congestive) heart failure: Secondary | ICD-10-CM

## 2022-02-01 NOTE — Patient Instructions (Signed)
Medication Instructions:  ?Your physician recommends that you continue on your current medications as directed. Please refer to the Current Medication list given to you today. ? ?*If you need a refill on your cardiac medications before your next appointment, please call your pharmacy* ? ? ?Lab Work: ?CMP/Lipids/Pro BNP ?If you have labs (blood work) drawn today and your tests are completely normal, you will receive your results only by: ?MyChart Message (if you have MyChart) OR ?A paper copy in the mail ?If you have any lab test that is abnormal or we need to change your treatment, we will call you to review the results. ? ? ?Testing/Procedures: ?None ? ? ?Follow-Up: ?At [facility], you and your health needs are our priority.  As part of our continuing mission to provide you with exceptional heart care, we have created designated Provider Care Teams.  These Care Teams include your primary Cardiologist (physician) and Advanced Practice Providers (APPs -  Physician Assistants and Nurse Practitioners) who all work together to provide you with the care you need, when you need it. ? ?We recommend signing up for the patient portal called "MyChart".  Sign up information is provided on this After Visit Summary.  MyChart is used to connect with patients for Virtual Visits (Telemedicine).  Patients are able to view lab/test results, encounter notes, upcoming appointments, etc.  Non-urgent messages can be sent to your provider as well.   ?To learn more about what you can do with MyChart, go to [email or web address].   ? ?Your next appointment:   ?4 month(s) ? ?The format for your next appointment:   ?In Person ? ?Provider:   ?Norman Herrlich, MD  ? ? ?Other Instructions ? ? ?

## 2022-02-04 LAB — COMPREHENSIVE METABOLIC PANEL
ALT: 19 IU/L (ref 0–44)
AST: 22 IU/L (ref 0–40)
Albumin/Globulin Ratio: 2.1 (ref 1.2–2.2)
Albumin: 4.8 g/dL (ref 3.8–4.8)
Alkaline Phosphatase: 60 IU/L (ref 44–121)
BUN/Creatinine Ratio: 23 (ref 10–24)
BUN: 36 mg/dL — ABNORMAL HIGH (ref 8–27)
Bilirubin Total: 0.2 mg/dL (ref 0.0–1.2)
CO2: 20 mmol/L (ref 20–29)
Calcium: 9.6 mg/dL (ref 8.6–10.2)
Chloride: 105 mmol/L (ref 96–106)
Creatinine, Ser: 1.55 mg/dL — ABNORMAL HIGH (ref 0.76–1.27)
Globulin, Total: 2.3 g/dL (ref 1.5–4.5)
Glucose: 96 mg/dL (ref 70–99)
Potassium: 5.8 mmol/L — ABNORMAL HIGH (ref 3.5–5.2)
Sodium: 136 mmol/L (ref 134–144)
Total Protein: 7.1 g/dL (ref 6.0–8.5)
eGFR: 49 mL/min/{1.73_m2} — ABNORMAL LOW (ref >59)

## 2022-02-04 LAB — LIPID PANEL
Chol/HDL Ratio: 2.4 ratio (ref 0.0–5.0)
Cholesterol, Total: 144 mg/dL (ref 100–199)
HDL: 59 mg/dL (ref >39)
LDL Chol Calc (NIH): 63 mg/dL (ref 0–99)
Triglycerides: 129 mg/dL (ref 0–149)
VLDL Cholesterol Cal: 22 mg/dL (ref 5–40)

## 2022-02-04 LAB — PRO B NATRIURETIC PEPTIDE: NT-Pro BNP: 1098 pg/mL — ABNORMAL HIGH (ref 0–376)

## 2022-02-05 ENCOUNTER — Telehealth: Payer: Self-pay | Admitting: *Deleted

## 2022-02-05 DIAGNOSIS — E875 Hyperkalemia: Secondary | ICD-10-CM

## 2022-02-05 NOTE — Telephone Encounter (Signed)
Informed pt of normal blood work, verified he is not taking any potassium and asked him to come back in 2 weeks to get another BMP to make sure potassium is not still elevated, if so need to put him on a binder like Lokelma. Pt verbalized understanding. ?

## 2022-02-05 NOTE — Telephone Encounter (Signed)
-----   Message from Baldo Daub, MD sent at 02/04/2022  5:27 PM EDT ----- ?Stable resolved except for potassium mildly elevated for sure is not taking any over-the-counter preps no change in meds but I would have him come back in about 2 weeks and repeat a BMP and if his potassium stays elevated we will need to put him on one of the resident binders like Lokelma ?

## 2022-02-15 ENCOUNTER — Other Ambulatory Visit: Payer: Self-pay | Admitting: Cardiology

## 2022-02-20 LAB — BASIC METABOLIC PANEL
BUN/Creatinine Ratio: 21 (ref 10–24)
BUN: 42 mg/dL — ABNORMAL HIGH (ref 8–27)
CO2: 19 mmol/L — ABNORMAL LOW (ref 20–29)
Calcium: 9.7 mg/dL (ref 8.6–10.2)
Chloride: 106 mmol/L (ref 96–106)
Creatinine, Ser: 1.97 mg/dL — ABNORMAL HIGH (ref 0.76–1.27)
Glucose: 102 mg/dL — ABNORMAL HIGH (ref 70–99)
Potassium: 6.4 mmol/L — ABNORMAL HIGH (ref 3.5–5.2)
Sodium: 139 mmol/L (ref 134–144)
eGFR: 37 mL/min/{1.73_m2} — ABNORMAL LOW (ref >59)

## 2022-02-22 ENCOUNTER — Telehealth: Payer: Self-pay

## 2022-02-22 DIAGNOSIS — E875 Hyperkalemia: Secondary | ICD-10-CM

## 2022-02-22 NOTE — Telephone Encounter (Signed)
Called pt regarding elevated potassium and pt's wife states that Dr. Bettina Gavia called over the weekend. ?

## 2022-02-23 IMAGING — CR DG CHEST 2V
2 series · 2 of 2 positions shown · non-contrast
Comparison: May 01, 2019.

CLINICAL DATA: Status post cardiac device insertion.

EXAM:
CHEST - 2 VIEW

[w chest pa]
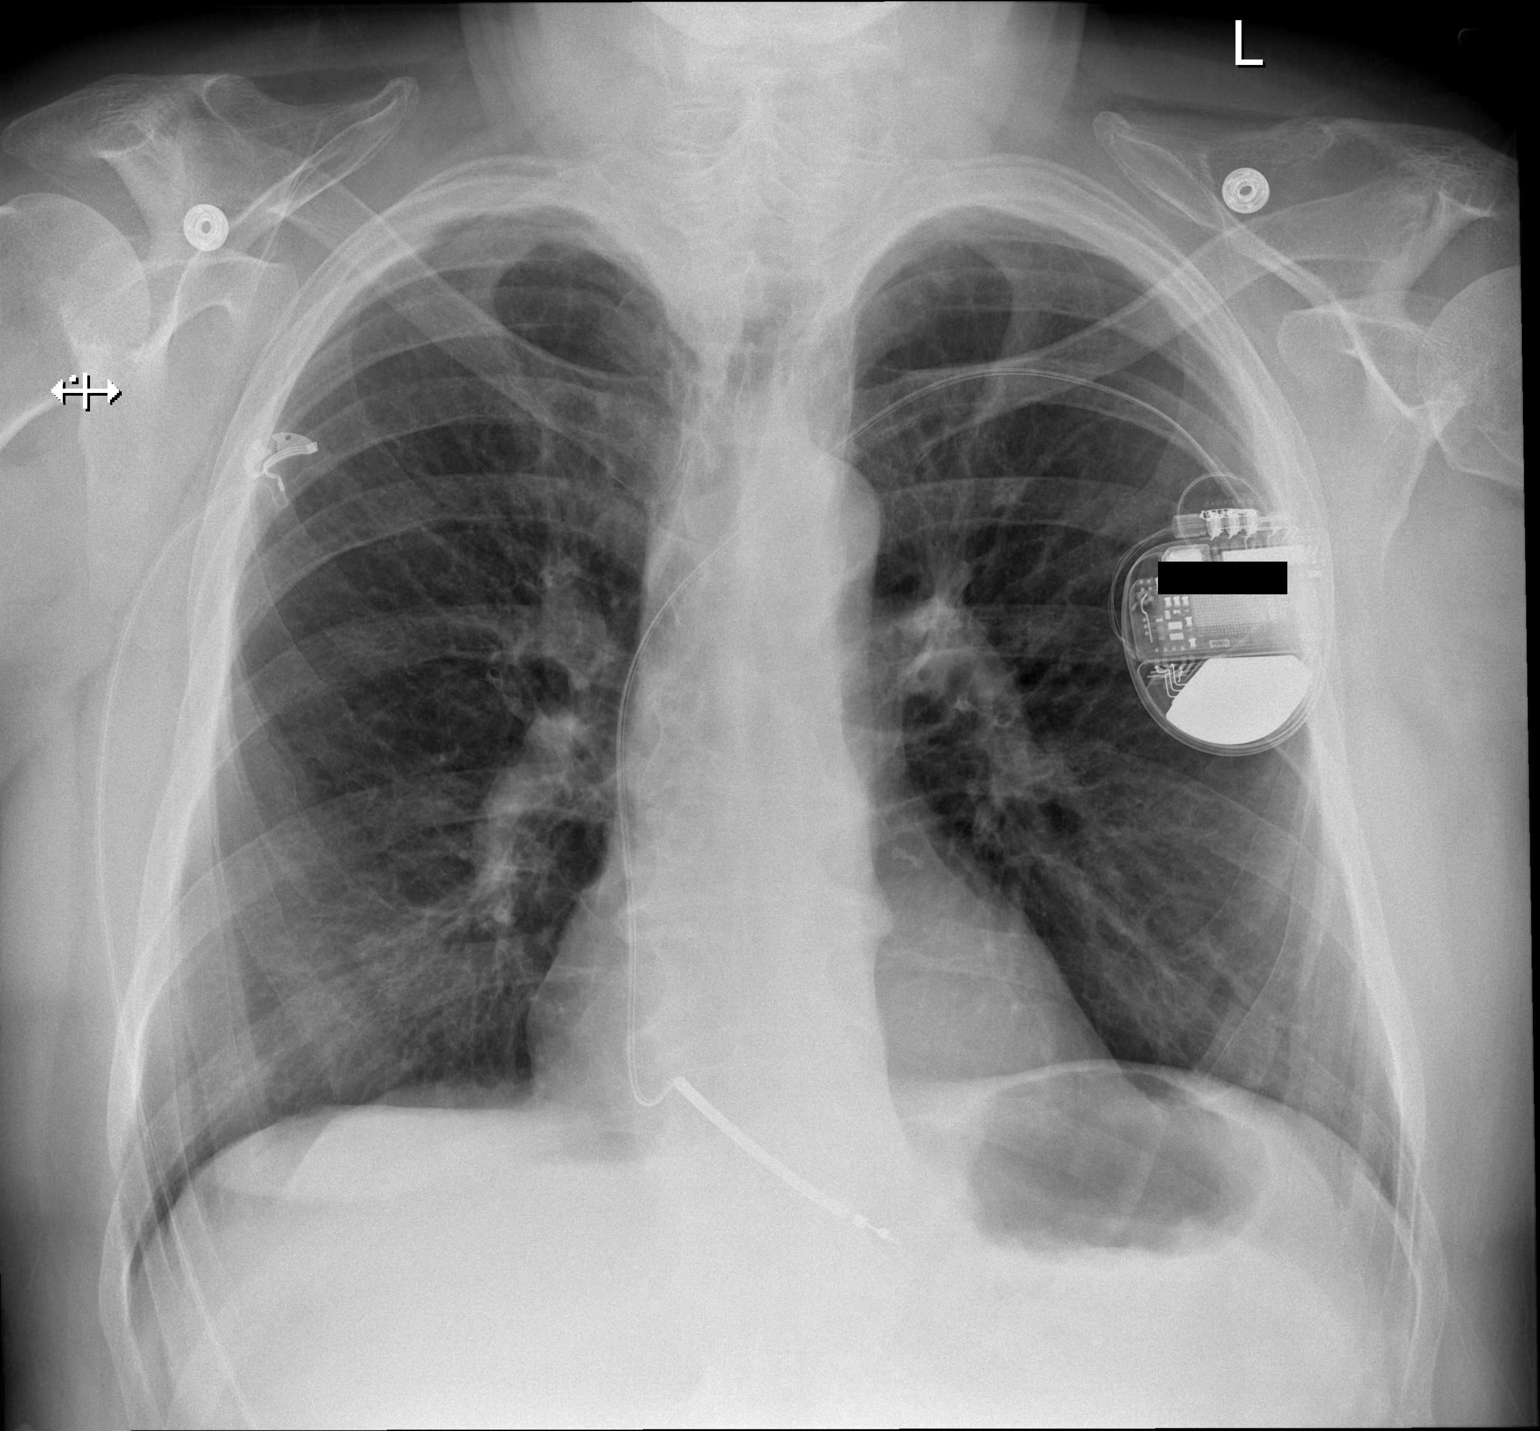

[w chest lat]
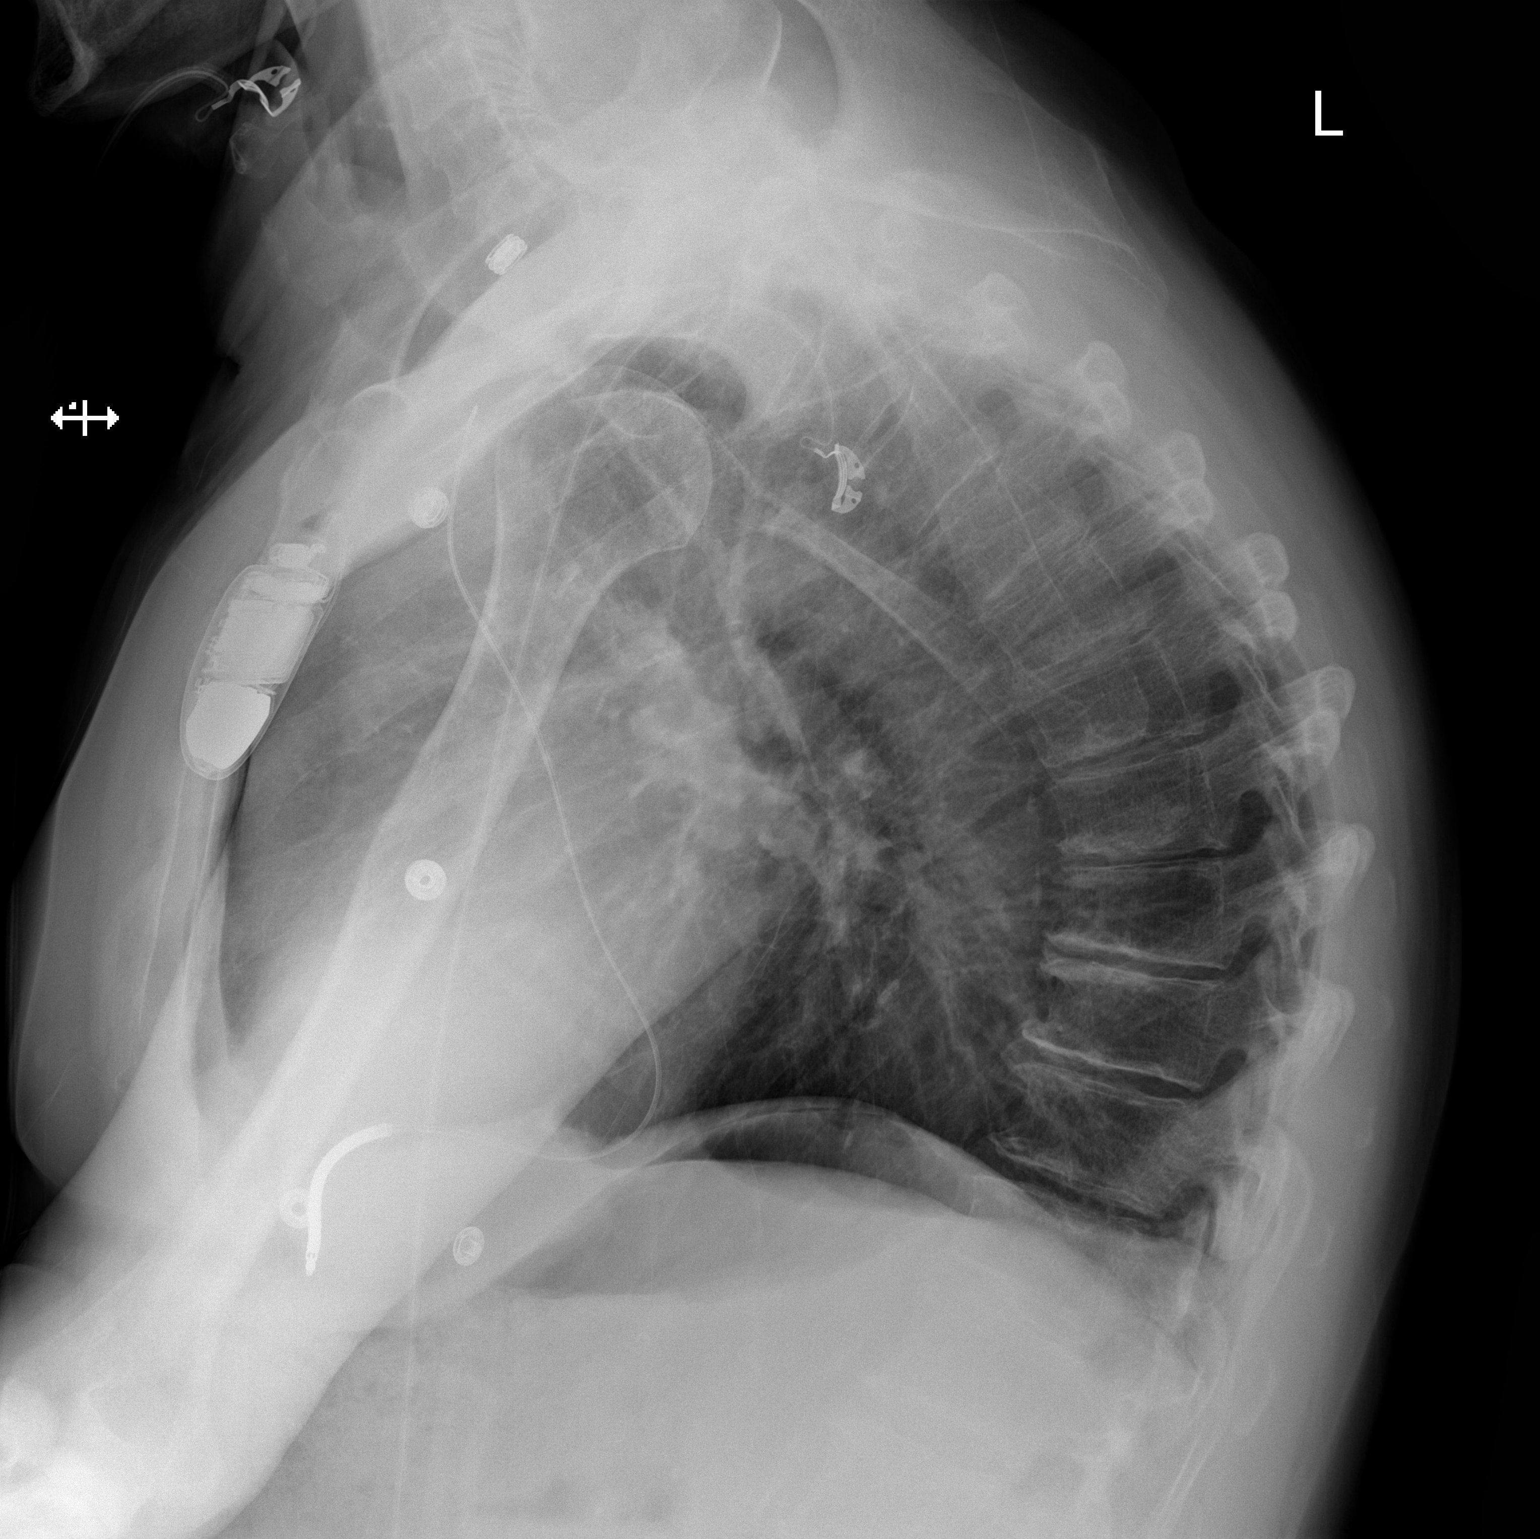

[2 of 2 positions shown; findings below may reference images not displayed]

FINDINGS: The heart size and mediastinal contours are within normal limits.
Both lungs are clear. Single lead left-sided pacemaker is noted with
lead in grossly good position over expected position of right
ventricle. No pneumothorax or pleural effusion is noted. The
visualized skeletal structures are unremarkable.
IMPRESSION: Status post single lead left-sided pacemaker placement.

## 2022-02-25 ENCOUNTER — Ambulatory Visit (INDEPENDENT_AMBULATORY_CARE_PROVIDER_SITE_OTHER): Payer: Managed Care, Other (non HMO)

## 2022-02-25 DIAGNOSIS — I420 Dilated cardiomyopathy: Secondary | ICD-10-CM

## 2022-02-25 LAB — CUP PACEART REMOTE DEVICE CHECK
Battery Remaining Longevity: 127 mo
Battery Voltage: 3.04 V
Brady Statistic RV Percent Paced: 0.01 %
Date Time Interrogation Session: 20230413033324
HighPow Impedance: 71 Ohm
Implantable Lead Implant Date: 20210714
Implantable Lead Location: 753860
Implantable Pulse Generator Implant Date: 20210714
Lead Channel Impedance Value: 513 Ohm
Lead Channel Impedance Value: 627 Ohm
Lead Channel Pacing Threshold Amplitude: 0.625 V
Lead Channel Pacing Threshold Pulse Width: 0.4 ms
Lead Channel Sensing Intrinsic Amplitude: 10.25 mV
Lead Channel Sensing Intrinsic Amplitude: 10.25 mV
Lead Channel Setting Pacing Amplitude: 2 V
Lead Channel Setting Pacing Pulse Width: 0.4 ms
Lead Channel Setting Sensing Sensitivity: 0.3 mV

## 2022-02-27 LAB — BASIC METABOLIC PANEL
BUN/Creatinine Ratio: 21 (ref 10–24)
BUN: 38 mg/dL — ABNORMAL HIGH (ref 8–27)
CO2: 20 mmol/L (ref 20–29)
Calcium: 9.2 mg/dL (ref 8.6–10.2)
Chloride: 105 mmol/L (ref 96–106)
Creatinine, Ser: 1.85 mg/dL — ABNORMAL HIGH (ref 0.76–1.27)
Glucose: 108 mg/dL — ABNORMAL HIGH (ref 70–99)
Potassium: 4.8 mmol/L (ref 3.5–5.2)
Sodium: 141 mmol/L (ref 134–144)
eGFR: 40 mL/min/{1.73_m2} — ABNORMAL LOW (ref >59)

## 2022-03-12 NOTE — Progress Notes (Signed)
Remote ICD transmission.   

## 2022-04-16 ENCOUNTER — Other Ambulatory Visit: Payer: Self-pay | Admitting: Cardiology

## 2022-05-27 ENCOUNTER — Ambulatory Visit (INDEPENDENT_AMBULATORY_CARE_PROVIDER_SITE_OTHER): Payer: Medicare Other

## 2022-05-27 DIAGNOSIS — I420 Dilated cardiomyopathy: Secondary | ICD-10-CM | POA: Diagnosis not present

## 2022-05-27 LAB — CUP PACEART REMOTE DEVICE CHECK
Battery Remaining Longevity: 125 mo
Battery Voltage: 3.04 V
Brady Statistic RV Percent Paced: 0.02 %
Date Time Interrogation Session: 20230713001704
HighPow Impedance: 75 Ohm
Implantable Lead Implant Date: 20210714
Implantable Lead Location: 753860
Implantable Pulse Generator Implant Date: 20210714
Lead Channel Impedance Value: 494 Ohm
Lead Channel Impedance Value: 627 Ohm
Lead Channel Pacing Threshold Amplitude: 0.75 V
Lead Channel Pacing Threshold Pulse Width: 0.4 ms
Lead Channel Sensing Intrinsic Amplitude: 9.625 mV
Lead Channel Sensing Intrinsic Amplitude: 9.625 mV
Lead Channel Setting Pacing Amplitude: 2 V
Lead Channel Setting Pacing Pulse Width: 0.4 ms
Lead Channel Setting Sensing Sensitivity: 0.3 mV

## 2022-06-03 ENCOUNTER — Ambulatory Visit (INDEPENDENT_AMBULATORY_CARE_PROVIDER_SITE_OTHER): Payer: Medicare Other | Admitting: Cardiology

## 2022-06-03 ENCOUNTER — Encounter: Payer: Self-pay | Admitting: Cardiology

## 2022-06-03 VITALS — BP 130/70 | HR 64 | Ht 70.0 in | Wt 166.0 lb

## 2022-06-03 DIAGNOSIS — N182 Chronic kidney disease, stage 2 (mild): Secondary | ICD-10-CM | POA: Diagnosis not present

## 2022-06-03 DIAGNOSIS — E875 Hyperkalemia: Secondary | ICD-10-CM

## 2022-06-03 DIAGNOSIS — I110 Hypertensive heart disease with heart failure: Secondary | ICD-10-CM

## 2022-06-03 DIAGNOSIS — I5022 Chronic systolic (congestive) heart failure: Secondary | ICD-10-CM | POA: Diagnosis not present

## 2022-06-03 DIAGNOSIS — E7800 Pure hypercholesterolemia, unspecified: Secondary | ICD-10-CM

## 2022-06-03 DIAGNOSIS — Z95810 Presence of automatic (implantable) cardiac defibrillator: Secondary | ICD-10-CM

## 2022-06-03 NOTE — Progress Notes (Signed)
Cardiology Office Note:    Date:  06/03/2022   ID:  Kenneth Pham, DOB [birth year], MRN [id number]  PCP:  Olive Bass, MD  Cardiologist:  Norman Herrlich, MD    Referring MD: Olive Bass, MD    ASSESSMENT:    1. Chronic systolic heart failure (HCC)   2. Hypertensive heart disease with heart failure (HCC)   3. CKD (chronic kidney disease) stage 2, GFR 60-89 ml/min   4. Hyperkalemia   5. ICD (implantable cardioverter-defibrillator) in place   6. Hypercholesteremia    PLAN:    In order of problems listed above:  [name] continues to do well with heart failure compensated New York Heart Association class II and a good medical regimen including his SGLT2 inhibitor and maximally tolerated carvedilol and Entresto was not on spironolactone because of a history of CKD and previous hyper kalemia continue his current medications Home self-management reminding to weigh daily and recheck his renal function today BP at target continue his guideline directed therapy Recheck renal function with a history of CKD and hyperkalemia Continue with statin we will recheck labs to be sure that remains effective and no recurrent toxicity Continue device clinic follow-up   Next appointment: 6 months   Medication Adjustments/Labs and Tests Ordered: Current medicines are reviewed at length with the patient today.  Concerns regarding medicines are outlined above.  No orders of the defined types were placed in this encounter.  No orders of the defined types were placed in this encounter.   Chief Complaint  Patient presents with   Follow-up   Congestive Heart Failure    History of Present Illness:    Kenneth Pham is a 66 y.o. male with a hx of chronic systolic heart failure with hypertensive heart disease most recent ejection fraction 30 to 35% stage II CKD hypertension hyperlipidemia and an ICD last seen 02/01/2022 with compensated heart failure on good medical therapy including his loop diuretic  beta-blocker SGLT2 inhibitor Entresto and was not on spironolactone due to CKD.  Compliance with diet, lifestyle and medications: Yes however he does not weigh daily.  Overall doing well he finds himself fatigued which is chronic but he can do activities like shopping and climbing stairs without stopping to rest which shortness of breath no edema orthopnea chest pain palpitation or syncope  His last device check 05/27/2022 showed normal device and lead parameters 1 episode of nonsustained VT Past Medical History:  Diagnosis Date   Acute CHF (HCC) 05/16/2019   Alcohol dependence in remission (HCC) 05/16/2019   Arteriosclerosis of carotid artery, right 05/15/2019   Bilateral carotid artery stenosis 05/15/2019   Cerebellar stroke, acute (HCC) 05/09/2019   Chronic anticoagulation 07/13/2019   CKD (chronic kidney disease) stage 2, GFR 60-89 ml/min 05/29/2019   Dilated cardiomyopathy (HCC) 05/29/2019   Heart failure with reduced ejection fraction (HCC) 05/09/2019   History of stroke involving cerebellum 05/09/2019   Hypercholesteremia    Hypertension    Hypertensive heart disease with heart failure (HCC) 05/29/2019   Nonsustained ventricular tachycardia (HCC) 06/19/2020   Formatting of this note might be different from the original. 2021: ICD   Precordial chest pain 08/21/2019   Prediabetes 06/08/2019   Formatting of this note might be different from the original. [facility] 05/29/2019: A1C 5.8%   Pulmonary nodules 05/15/2019   05/01/2019 CT angio - 8.96mm bandlike nodular density lingula - 5.54mm R middle lobe - 61.5 pack year hx, rec 3 month f/u   Shortness of breath 08/21/2019  Snores 05/29/2019   Stage 3a chronic kidney disease (HCC) 05/29/2019   Viral respiratory infection 10/16/2021   Formatting of this note might be different from the original. 10/16/2021: COVID negative    Past Surgical History:  Procedure Laterality Date   APPENDECTOMY     CATARACT EXTRACTION Right    ICD IMPLANT N/A 05/28/2020    Procedure: ICD IMPLANT;  Surgeon: Regan Lemming, MD;  Location: [facility];  Service: Cardiovascular;  Laterality: N/A;   TONSILLECTOMY      Current Medications: Current Meds  Medication Sig   albuterol (VENTOLIN HFA) 108 (90 Base) MCG/ACT inhaler Inhale 2 puffs into the lungs every 6 (six) hours as needed for wheezing or shortness of breath.   aspirin EC 81 MG tablet Take 81 mg by mouth daily.    atorvastatin (LIPITOR) 80 MG tablet TAKE 1 TABLET (80 MG TOTAL) BY MOUTH DAILY. CHOLESTEROL   carvedilol (COREG) 25 MG tablet Take 1 tablet (25 mg total) by mouth 2 (two) times daily. Dose increase   dapagliflozin propanediol (FARXIGA) 10 MG TABS tablet Take 1 tablet (10 mg total) by mouth daily before breakfast.   ENTRESTO 97-103 MG TAKE 1 TABLET BY MOUTH TWICE A DAY   furosemide (LASIX) 40 MG tablet Take 1 tablet (40 mg total) by mouth daily.   prednisoLONE acetate (PRED FORTE) 1 % ophthalmic suspension Place 1 drop into the right eye 3 (three) times daily.     Allergies:   Patient has no known allergies.   Social History   Socioeconomic History   Marital status: Widowed    Spouse name: Not on file   Number of children: Not on file   Years of education: Not on file   Highest education level: Not on file  Occupational History   Not on file  Tobacco Use   Smoking status: Former    Types: Cigarettes    Quit date: 04/29/2019    Years since quitting: 3.0   Smokeless tobacco: Never  Vaping Use   Vaping Use: Never used  Substance and Sexual Activity   Alcohol use: Not Currently   Drug use: Not Currently    Types: Marijuana   Sexual activity: Yes  Other Topics Concern   Not on file  Social History Narrative   Not on file   Social Determinants of Health   Financial Resource Strain: Not on file  Food Insecurity: Not on file  Transportation Needs: Not on file  Physical Activity: Not on file  Stress: Not on file  Social Connections: Not on file     Family  History: The patient's family history includes Cancer in his father; Heart Problems in his brother; Stroke in his mother. ROS:   Please see the history of present illness.    All other systems reviewed and are negative.  EKGs/Labs/Other Studies Reviewed:    The following studies were reviewed today:    Recent Labs: 02/01/2022: ALT 19; NT-Pro BNP 1,098 02/26/2022: BUN 38; Creatinine, Ser 1.85; Potassium 4.8; Sodium 141  Recent Lipid Panel    Component Value Date/Time   CHOL 144 02/01/2022 0820   TRIG 129 02/01/2022 0820   HDL 59 02/01/2022 0820   CHOLHDL 2.4 02/01/2022 0820   LDLCALC 63 02/01/2022 0820    Physical Exam:    VS:  BP 130/70 (BP Location: Left Arm, Patient Position: Sitting, Cuff Size: Normal)   Pulse 64   Ht 5\' 10"  (1.778 m)   Wt 166 lb (75.3 kg)  SpO2 97%   BMI 23.82 kg/m     Wt Readings from Last 3 Encounters:  06/03/22 166 lb (75.3 kg)  02/01/22 175 lb (79.4 kg)  11/23/21 172 lb 9.6 oz (78.3 kg)     GEN:  Well nourished, well developed in no acute distress HEENT: Normal NECK: No JVD; No carotid bruits LYMPHATICS: No lymphadenopathy CARDIAC: RRR, no murmurs, rubs, gallops RESPIRATORY:  Clear to auscultation without rales, wheezing or rhonchi  ABDOMEN: Soft, non-tender, non-distended MUSCULOSKELETAL:  No edema; No deformity  SKIN: Warm and dry NEUROLOGIC:  Alert and oriented x 3 PSYCHIATRIC:  Normal affect    Signed, Norman Herrlich, MD  06/03/2022 12:52 PM    [facility]

## 2022-06-03 NOTE — Patient Instructions (Addendum)
Medication Instructions:  Your physician recommends that you continue on your current medications as directed. Please refer to the Current Medication list given to you today.  *If you need a refill on your cardiac medications before your next appointment, please call your pharmacy*   Lab Work: Your physician recommends that you return for lab work in:   Labs today: CMP, Lipids  If you have labs (blood work) drawn today and your tests are completely normal, you will receive your results only by: MyChart Message (if you have MyChart) OR A paper copy in the mail If you have any lab test that is abnormal or we need to change your treatment, we will call you to review the results.   Testing/Procedures: None   Follow-Up: At [facility], you and your health needs are our priority.  As part of our continuing mission to provide you with exceptional heart care, we have created designated Provider Care Teams.  These Care Teams include your primary Cardiologist (physician) and Advanced Practice Providers (APPs -  Physician Assistants and Nurse Practitioners) who all work together to provide you with the care you need, when you need it.  We recommend signing up for the patient portal called "MyChart".  Sign up information is provided on this After Visit Summary.  MyChart is used to connect with patients for Virtual Visits (Telemedicine).  Patients are able to view lab/test results, encounter notes, upcoming appointments, etc.  Non-urgent messages can be sent to your provider as well.   To learn more about what you can do with MyChart, go to [email or web address].    Your next appointment:   6 month(s)  The format for your next appointment:   In Person  Provider:   Norman Herrlich, MD    Other Instructions None  Important Information About Sugar         Heart Failure  Weigh yourself every morning when you first wake up and record on a calender or note pad, bring this to your  office visits. Using a pill tender can help with taking your medications consistently.  Limit your fluid intake to 2 liters daily  Limit your sodium intake to less than 2-3 grams daily. Ask if you need dietary teaching.  If you gain more than 3 pounds (from your dry weight ), double your dose of diuretic for the day.  If you gain more than 5 pounds (from your dry weight), double your dose of lasix and call your heart failure doctor.  Please do not smoke tobacco since it is very bad for your heart.  Please do not drink alcohol since it can worsen your heart failure.Also avoid OTC nonsteroidal drugs, such as advil, aleve and motrin.  Try to exercise for at least 30 minutes every day because this will help your heart be more efficient. You may be eligible for supervised cardiac rehab, ask your physician.

## 2022-06-04 LAB — COMPREHENSIVE METABOLIC PANEL
ALT: 29 IU/L (ref 0–44)
AST: 24 IU/L (ref 0–40)
Albumin/Globulin Ratio: 1.9 (ref 1.2–2.2)
Albumin: 4.7 g/dL (ref 3.9–4.9)
Alkaline Phosphatase: 62 IU/L (ref 44–121)
BUN/Creatinine Ratio: 22 (ref 10–24)
BUN: 45 mg/dL — ABNORMAL HIGH (ref 8–27)
Bilirubin Total: 0.3 mg/dL (ref 0.0–1.2)
CO2: 22 mmol/L (ref 20–29)
Calcium: 9.6 mg/dL (ref 8.6–10.2)
Chloride: 97 mmol/L (ref 96–106)
Creatinine, Ser: 2.05 mg/dL — ABNORMAL HIGH (ref 0.76–1.27)
Globulin, Total: 2.5 g/dL (ref 1.5–4.5)
Glucose: 99 mg/dL (ref 70–99)
Potassium: 6.5 mmol/L — ABNORMAL HIGH (ref 3.5–5.2)
Sodium: 133 mmol/L — ABNORMAL LOW (ref 134–144)
Total Protein: 7.2 g/dL (ref 6.0–8.5)
eGFR: 35 mL/min/{1.73_m2} — ABNORMAL LOW (ref >59)

## 2022-06-04 LAB — LIPID PANEL
Chol/HDL Ratio: 2.2 ratio (ref 0.0–5.0)
Cholesterol, Total: 143 mg/dL (ref 100–199)
HDL: 64 mg/dL (ref >39)
LDL Chol Calc (NIH): 68 mg/dL (ref 0–99)
Triglycerides: 50 mg/dL (ref 0–149)
VLDL Cholesterol Cal: 11 mg/dL (ref 5–40)

## 2022-06-07 ENCOUNTER — Other Ambulatory Visit: Payer: Self-pay

## 2022-06-07 DIAGNOSIS — I5022 Chronic systolic (congestive) heart failure: Secondary | ICD-10-CM

## 2022-06-07 DIAGNOSIS — E875 Hyperkalemia: Secondary | ICD-10-CM

## 2022-06-07 MED ORDER — LOKELMA 10 G PO PACK
PACK | ORAL | 3 refills | Status: DC
Start: 2022-06-07 — End: 2022-09-28

## 2022-06-07 MED ORDER — FUROSEMIDE 40 MG PO TABS: 40.0000 mg | ORAL_TABLET | ORAL | 2 refills | Status: DC | PRN

## 2022-06-10 NOTE — Progress Notes (Signed)
Remote ICD transmission.   

## 2022-06-14 LAB — BASIC METABOLIC PANEL
BUN/Creatinine Ratio: 13 (ref 10–24)
BUN: 18 mg/dL (ref 8–27)
CO2: 23 mmol/L (ref 20–29)
Calcium: 9.7 mg/dL (ref 8.6–10.2)
Chloride: 102 mmol/L (ref 96–106)
Creatinine, Ser: 1.39 mg/dL — ABNORMAL HIGH (ref 0.76–1.27)
Glucose: 105 mg/dL — ABNORMAL HIGH (ref 70–99)
Potassium: 5.7 mmol/L — ABNORMAL HIGH (ref 3.5–5.2)
Sodium: 134 mmol/L (ref 134–144)
eGFR: 56 mL/min/{1.73_m2} — ABNORMAL LOW (ref >59)

## 2022-08-17 ENCOUNTER — Other Ambulatory Visit: Payer: Self-pay | Admitting: Cardiology

## 2022-08-18 ENCOUNTER — Other Ambulatory Visit: Payer: Self-pay | Admitting: Cardiology

## 2022-08-18 NOTE — Telephone Encounter (Signed)
Refill sent to pharmacy.   

## 2022-08-26 ENCOUNTER — Ambulatory Visit (INDEPENDENT_AMBULATORY_CARE_PROVIDER_SITE_OTHER): Payer: Medicare Other

## 2022-08-26 DIAGNOSIS — I420 Dilated cardiomyopathy: Secondary | ICD-10-CM | POA: Diagnosis not present

## 2022-08-26 LAB — CUP PACEART REMOTE DEVICE CHECK
Battery Remaining Longevity: 123 mo
Battery Voltage: 3.04 V
Brady Statistic RV Percent Paced: 0.01 %
Date Time Interrogation Session: 20231012001703
HighPow Impedance: 86 Ohm
Implantable Lead Implant Date: 20210714
Implantable Lead Location: 753860
Implantable Pulse Generator Implant Date: 20210714
Lead Channel Impedance Value: 532 Ohm
Lead Channel Impedance Value: 665 Ohm
Lead Channel Pacing Threshold Amplitude: 0.75 V
Lead Channel Pacing Threshold Pulse Width: 0.4 ms
Lead Channel Sensing Intrinsic Amplitude: 9.125 mV
Lead Channel Sensing Intrinsic Amplitude: 9.125 mV
Lead Channel Setting Pacing Amplitude: 2 V
Lead Channel Setting Pacing Pulse Width: 0.4 ms
Lead Channel Setting Sensing Sensitivity: 0.3 mV

## 2022-09-02 NOTE — Progress Notes (Signed)
Remote ICD transmission.   

## 2022-09-19 ENCOUNTER — Other Ambulatory Visit: Payer: Self-pay | Admitting: Cardiology

## 2022-11-25 ENCOUNTER — Telehealth: Payer: Self-pay

## 2022-11-25 ENCOUNTER — Ambulatory Visit (INDEPENDENT_AMBULATORY_CARE_PROVIDER_SITE_OTHER): Payer: Medicare Other

## 2022-11-25 DIAGNOSIS — I420 Dilated cardiomyopathy: Secondary | ICD-10-CM

## 2022-11-25 LAB — CUP PACEART REMOTE DEVICE CHECK
Battery Remaining Longevity: 121 mo
Battery Voltage: 3.03 V
Brady Statistic RV Percent Paced: 0.01 %
Date Time Interrogation Session: 20240111033522
HighPow Impedance: 65 Ohm
Implantable Lead Connection Status: 753985
Implantable Lead Implant Date: 20210714
Implantable Lead Location: 753860
Implantable Pulse Generator Implant Date: 20210714
Lead Channel Impedance Value: 399 Ohm
Lead Channel Impedance Value: 494 Ohm
Lead Channel Pacing Threshold Amplitude: 0.75 V
Lead Channel Pacing Threshold Pulse Width: 0.4 ms
Lead Channel Sensing Intrinsic Amplitude: 11.5 mV
Lead Channel Sensing Intrinsic Amplitude: 11.5 mV
Lead Channel Setting Pacing Amplitude: 2 V
Lead Channel Setting Pacing Pulse Width: 0.4 ms
Lead Channel Setting Sensing Sensitivity: 0.3 mV
Zone Setting Status: 755011
Zone Setting Status: 755011

## 2022-11-25 NOTE — Telephone Encounter (Signed)
Scheduled remote reviewed. Normal device function.   There were 3 NSVT arrhythmias detected and 2 were greater than 20 beats, sent to triage Next remote 91 days. Kathy Breach, RN, CCDS, [employer]  Noted, patient is due for appointment with Dr. Curt Bears. Routing to scheduler to get set up for follow up.

## 2022-12-10 ENCOUNTER — Other Ambulatory Visit: Payer: Self-pay | Admitting: Cardiology

## 2022-12-10 NOTE — Telephone Encounter (Signed)
Rx refill sent to pharmacy. 

## 2022-12-10 NOTE — Telephone Encounter (Addendum)
Called patient to follow up on his transmission. (NSVT episodes but upon further review note increased Optivol.) (11/25/22 transmission) I would like an up to date reading and will assist patient with sending on Monday am 1/29 to re-evaluate his fluid retention.  Just spoke with patient on phone this pm: He has noted increase in LE edema and mildly SOB at times on exertion. He is going to take his PRN Furosemide over the weekend which he started yesterday.  He knows if swelling and/or SOB worsens over the weekend he is to go to the ER. Concern for fluid volume overload.  Will flag for Dr. Bettina Gavia to review for follow up on his heart failure.  Current appt with Dr. Curt Bears is on 12/20/22.  Patient reports no other symptoms and cannot recall anything out of the ordinary correlating with NSVT episodes. He states he is taking all of his medications as prescribed but "may have missed a day or 2 this month".   I will call patient first thing Monday am and follow up with him.   Only 1 NSVT episode noted to be 23 beats which occurred in November and the other 2 episodes recorded were less than 10 beats one of which occurred on January 7th.     Longest NSVT (23 beats) episode on NOVEMBER 30TH, no recall of symptoms this far out.

## 2022-12-13 NOTE — Telephone Encounter (Signed)
Spoke with patient to follow up from Friday. Received updated transmission. Optivol has normalized Patient reports decrease s/s of fluid retention after taking Lasix over the weekend, states, "I can see the veins in my feet again".  He will go back to prn dosing as Dr. Bettina Gavia had ordered.  Patient encouraged to get a scale and keep track of his weight.   Patient is doing well, no symptoms. No further arrhythmia episodes.  He has device number should any concerns develop. Will see Dr. Curt Bears on 12/20/22.

## 2022-12-15 NOTE — Progress Notes (Signed)
Remote ICD transmission.   

## 2022-12-20 ENCOUNTER — Ambulatory Visit: Payer: 59 | Attending: Cardiology | Admitting: Cardiology

## 2022-12-20 ENCOUNTER — Encounter: Payer: Self-pay | Admitting: Cardiology

## 2022-12-20 VITALS — BP 157/83 | HR 74 | Ht 70.0 in | Wt 172.0 lb

## 2022-12-20 DIAGNOSIS — I10 Essential (primary) hypertension: Secondary | ICD-10-CM

## 2022-12-20 DIAGNOSIS — I5022 Chronic systolic (congestive) heart failure: Secondary | ICD-10-CM | POA: Diagnosis not present

## 2022-12-20 DIAGNOSIS — I493 Ventricular premature depolarization: Secondary | ICD-10-CM

## 2022-12-20 LAB — CUP PACEART INCLINIC DEVICE CHECK
Battery Remaining Longevity: 120 mo
Battery Voltage: 3.03 V
Brady Statistic RV Percent Paced: 0.01 %
Date Time Interrogation Session: 20240205091520
HighPow Impedance: 78 Ohm
Implantable Lead Connection Status: 753985
Implantable Lead Implant Date: 20210714
Implantable Lead Location: 753860
Implantable Pulse Generator Implant Date: 20210714
Lead Channel Impedance Value: 513 Ohm
Lead Channel Impedance Value: 589 Ohm
Lead Channel Pacing Threshold Amplitude: 0.625 V
Lead Channel Pacing Threshold Pulse Width: 0.4 ms
Lead Channel Sensing Intrinsic Amplitude: 10 mV
Lead Channel Sensing Intrinsic Amplitude: 9.625 mV
Lead Channel Setting Pacing Amplitude: 2 V
Lead Channel Setting Pacing Pulse Width: 0.4 ms
Lead Channel Setting Sensing Sensitivity: 0.3 mV
Zone Setting Status: 755011
Zone Setting Status: 755011

## 2022-12-20 NOTE — Patient Instructions (Signed)
Medication Instructions:  Your physician recommends that you continue on your current medications as directed. Please refer to the Current Medication list given to you today.  *If you need a refill on your cardiac medications before your next appointment, please call your pharmacy*   Lab Work: None ordered   Testing/Procedures: None ordered   Follow-Up: At [facility], you and your health needs are our priority.  As part of our continuing mission to provide you with exceptional heart care, we have created designated Provider Care Teams.  These Care Teams include your primary Cardiologist (physician) and Advanced Practice Providers (APPs -  Physician Assistants and Nurse Practitioners) who all work together to provide you with the care you need, when you need it.  We recommend signing up for the patient portal called "MyChart".  Sign up information is provided on this After Visit Summary.  MyChart is used to connect with patients for Virtual Visits (Telemedicine).  Patients are able to view lab/test results, encounter notes, upcoming appointments, etc.  Non-urgent messages can be sent to your provider as well.   To learn more about what you can do with MyChart, go to [email or web address].    Remote monitoring is used to monitor your Pacemaker or ICD from home. This monitoring reduces the number of office visits required to check your device to one time per year. It allows [place] to keep an eye on the functioning of your device to ensure it is working properly. You are scheduled for a device check from home on 02/14/2023. You may send your transmission at any time that day. If you have a wireless device, the transmission will be sent automatically. After your physician reviews your transmission, you will receive a postcard with your next transmission date.  Your next appointment:   1 year(s)  The format for your next appointment:   In Person  Provider:   Allegra Lai, MD{    Thank you  for choosing [facility]!!   Trinidad Curet, RN [phone]

## 2022-12-20 NOTE — Progress Notes (Addendum)
Electrophysiology Office Note   Date:  12/20/2022   ID:  Kenneth Pham 25-Oct-1956, MRN [id number]  PCP:  Kenneth Greenhouse, MD  Cardiologist:  Kenneth Pham  Primary Electrophysiologist:  Kenneth Borak Meredith Leeds, MD    Chief Complaint: CHF, PVCs   History of Present Illness: Kenneth Pham is a 67 y.o. male who is being seen today for the evaluation of CHF at the request of [name], Jaymes Graff, MD. Presenting today for electrophysiology evaluation.  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  He has NYHA class II symptoms.  He is post Medtronic ICD implanted 05/28/2020.  He previously had an episode of nonsustained VT and his carvedilol dose was increased.  Today, denies symptoms of palpitations, chest pain, shortness of breath, orthopnea, PND, lower extremity edema, claudication, dizziness, presyncope, syncope, bleeding, or neurologic sequela. The patient is tolerating medications without difficulties.  He has been feeling well.  He does note an increase in shortness of breath at times.  In reviewing his OptiVol, it is quite variable.  Today he feels well and has been able to do his daily activities.  Past Medical History:  Diagnosis Date   Acute CHF ([place]) 05/16/2019   Alcohol dependence in remission ([place]) 05/16/2019   Arteriosclerosis of carotid artery, right 05/15/2019   Bilateral carotid artery stenosis 05/15/2019   Cerebellar stroke, acute ([place]) 05/09/2019   Chronic anticoagulation 07/13/2019   CKD (chronic kidney disease) stage 2, GFR 60-89 ml/min 05/29/2019   Dilated cardiomyopathy ([place]) 05/29/2019   Heart failure with reduced ejection fraction ([place]) 05/09/2019   History of stroke involving cerebellum 05/09/2019   Hypercholesteremia    Hypertension    Hypertensive heart disease with heart failure ([place]) 05/29/2019   Nonsustained ventricular tachycardia ([place]) 06/19/2020   Formatting of  this note might be different from the original. 2021: ICD   Precordial chest pain 08/21/2019   Prediabetes 06/08/2019   Formatting of this note might be different from the original. [facility] 05/29/2019: A1C 5.8%   Pulmonary nodules 05/15/2019   05/01/2019 CT angio - 8.61mm bandlike nodular density lingula - 5.81mm R middle lobe - 61.5 pack year hx, rec 3 month f/u   Shortness of breath 08/21/2019   Snores 05/29/2019   Stage 3a chronic kidney disease ([place]) 05/29/2019   Viral respiratory infection 10/16/2021   Formatting of this note might be different from the original. 10/16/2021: COVID negative   Past Surgical History:  Procedure Laterality Date   APPENDECTOMY     CATARACT EXTRACTION Right    ICD IMPLANT N/A 05/28/2020   Procedure: ICD IMPLANT;  Surgeon: Kenneth Haw, MD;  Location: [place] [facility];  Service: Cardiovascular;  Laterality: N/A;   TONSILLECTOMY       Current Outpatient Medications  Medication Sig Dispense Refill   albuterol (VENTOLIN HFA) 108 (90 Base) MCG/ACT inhaler Inhale 2 puffs into the lungs every 6 (six) hours as needed for wheezing or shortness of breath. 8.5 each 2   aspirin EC 81 MG tablet Take 81 mg by mouth daily.      atorvastatin (LIPITOR) 80 MG tablet TAKE 1 TABLET BY MOUTH DAILY 90 tablet 2   ENTRESTO 97-103 MG TAKE 1 TABLET BY MOUTH TWICE A DAY 60 tablet 11   FARXIGA 10 MG TABS tablet TAKE 1 TABLET BY MOUTH DAILY BEFORE BREAKFAST. 90 tablet 3   furosemide (LASIX) 40 MG tablet Take 1  tablet (40 mg total) by mouth as needed (take only when weight goes up 3 pounds from baseline). 45 tablet 2   prednisoLONE acetate (PRED FORTE) 1 % ophthalmic suspension Place 1 drop into the right eye 3 (three) times daily.     sodium zirconium cyclosilicate (LOKELMA) 10 g PACK packet TAKE 1 PACKET BY MOUTH DAILY FOR 30 DAYS 30 packet 3   carvedilol (COREG) 25 MG tablet Take 1 tablet (25 mg total) by mouth 2 (two) times daily. Dose increase 180 tablet 3   No current  facility-administered medications for this visit.    Allergies:   Patient has no known allergies.   Social History:  The patient  reports that he quit smoking about 3 years ago. His smoking use included cigarettes. He has never used smokeless tobacco. He reports that he does not currently use alcohol. He reports that he does not currently use drugs after having used the following drugs: Marijuana.   Family History:  The patient's family history includes Cancer in his father; Heart Problems in his brother; Stroke in his mother.   ROS:  Please see the history of present illness.   Otherwise, review of systems is positive for none.   All other systems are reviewed and negative.   PHYSICAL EXAM: VS:  BP (!) 152/84   Pulse 74   Ht 5\' 10"  (1.778 m)   Wt 172 lb (78 kg)   SpO2 99%   BMI 24.68 kg/m  , BMI Body mass index is 24.68 kg/m. GEN: Well nourished, well developed, in no acute distress  HEENT: normal  Neck: no JVD, carotid bruits, or masses Cardiac: RRR; no murmurs, rubs, or gallops,no edema  Respiratory:  clear to auscultation bilaterally, normal work of breathing GI: soft, nontender, nondistended, + BS MS: no deformity or atrophy  Skin: warm and dry, device site well healed Neuro:  Strength and sensation are intact Psych: euthymic mood, full affect  EKG:  EKG is ordered today. Personal review of the ekg ordered shows sinus rhythm  Personal review of the device interrogation today. Results in [place] today  Recent Labs: 02/01/2022: NT-Pro BNP 1,098 06/03/2022: ALT 29 06/14/2022: BUN 18; Creatinine, Ser 1.39; Potassium 5.7; Sodium 134    Lipid Panel     Component Value Date/Time   CHOL 143 06/03/2022 1300   TRIG 50 06/03/2022 1300   HDL 64 06/03/2022 1300   CHOLHDL 2.2 06/03/2022 1300   LDLCALC 68 06/03/2022 1300     Wt Readings from Last 3 Encounters:  12/20/22 172 lb (78 kg)  06/03/22 166 lb (75.3 kg)  02/01/22 175 lb (79.4 kg)      Other studies  Reviewed: Additional studies/ records that were reviewed today include: TTE 08/02/19  Review of the above records today demonstrates:   1. The left ventricle has moderate-severely reduced systolic function,  with an ejection fraction of 30-35%. There is diffuse hypokinesis of the  Left ventricle. There is mild concentric left ventricular hypertrophy.  Left ventricular diastolic Doppler  parameters are consistent with pseudonormalization. Elevated mean left  atrial pressure.   2. There is abnormal global logitudinal strain (-7.5%).   3. The right ventricle has normal systolic function.   4. Left atrial and Right atrial size was normal.   5. Mild thickening of the aortic valve. Mild calcification of the aortic  valve. Aortic valve regurgitation was not visualized by color flow  Doppler.   Monitor 02/19/20 personally  Reviewed The rhythm throughout was sinus with  minimum average and maximum heart rates of 51, 81 and 128 bpm. Ventricular ectopy was occasional PVCs and rare couplets and triplets.  There were 11 runs of PVCs the fastest 12 complexes at a rate of 163 bpm and the longest 16.2 seconds at a relatively slow rate of 113 bpm.    ASSESSMENT AND PLAN:  1.  Chronic systolic heart failure: Due to dilated cardiomyopathy.  NYHA class II symptoms.  Currently on Entresto and carvedilol.  Status post Medtronic ICD implanted 05/28/2020.  Device function appropriately.  His of all has been quite variable.  Donathan Buller have him enroll in [facility] clinic.  2.  Hypertension: Mildly elevated but usually well-controlled.  No changes.  3.  Hyperlipidemia: Continue statin per primary cardiology  4.  Nonsustained VT: Noted on device interrogation.  Carvedilol was increased at his last visit.  Continue to have short episodes.  Caili Escalera continue with current management.  I was chaperoned during this clinic visit by Trinidad Curet, Myrtie Hawk, Geoffry Paradise.  Current medicines are reviewed at length with the patient  today.   The patient does not have concerns regarding his medicines.  The following changes were made today: none  Labs/ tests ordered today include:  Orders Placed This Encounter  Procedures   EKG 12-Lead     Disposition:   FU 12 months  Signed, Daiki Dicostanzo Meredith Leeds, MD  12/20/2022 9:13 AM     [facility] [address] [place] [address] [phone] (office) [phone] (fax)

## 2022-12-22 ENCOUNTER — Telehealth: Payer: Self-pay

## 2022-12-22 NOTE — Telephone Encounter (Addendum)
From: Damian Leavell, RN Sent: 12/20/2022   9:13 AM EST To: Rosalene Billings, RN Subject: referral to ICM clinic                         Good morning!  Dr. Curt Bears would like to refer this Pt for ICM clinic.  He discussed with him at his office visit today and he is agreeable.  Do you need me to do anything else other than let you know?  Sonia Baller RN

## 2022-12-22 NOTE — Telephone Encounter (Signed)
Referred to [facility] clinic by Dr Curt Bears.    Patient is agreeable to ICM monthly follow up.  Advised transmission will send automatically between 12 Midnight and 6:00 AM if monitor is by bedside.  Explained will call with results after transmission is reviewed.  E  Provided ICM direct number and explained should call if experiencing any fluid symptoms such as weight gain, shortness of breath or extremity/abdominal swelling. He reports he typically has swelling of ankles and feet when he has fluid.  He takes PRN Furosemide about twice a week. 1st ICM remote transmission scheduled for 01/16/2022.

## 2023-01-17 ENCOUNTER — Ambulatory Visit: Payer: 59 | Attending: Cardiology

## 2023-01-17 DIAGNOSIS — I5022 Chronic systolic (congestive) heart failure: Secondary | ICD-10-CM | POA: Diagnosis not present

## 2023-01-17 DIAGNOSIS — Z95810 Presence of automatic (implantable) cardiac defibrillator: Secondary | ICD-10-CM | POA: Diagnosis not present

## 2023-01-18 NOTE — Progress Notes (Signed)
EPIC Encounter for ICM Monitoring  [name] is a 67 y.o. male Date: 01/18/2023 Primary Care Physican: Algis Greenhouse, MD Primary Cardiologist: [facility]     Electrophysiologist: Curt Bears 01/18/2023 Weight: 173 lbs (baseline 173-175 lbs)       1st ICM remote transmission.  Heart Failure questions reviewed.  Pt asymptomatic for fluid accumulation and reports feeling well.    Optivol thoracic impedance suggesting normal fluid levels since 2/5.   Prescribed:  Furosemide 40 mg take 1 tablet(s) (40 mg total) by mouth as needed (take only when weight goes up 3 lbs from baseline).  Labs: 09/09/2022 Creatinine 1.48, BUN 22, Potassium 5.0, Sodium 138, GFR 52  A complete set of results can be found in Results Review.  Recommendations: Encouraged to call if experiencing fluid symptoms.  Follow-up plan: ICM clinic phone appointment on 02/21/2023.   91 day device clinic remote transmission 02/24/2023.    EP/Cardiology Office Visits: 01/26/2023 with Dr. Bettina Gavia.    Copy of ICM check sent to Dr. Curt Bears.   3 month ICM trend: 01/17/2023.    12-14 Month ICM trend:     Rosalene Billings, RN 01/18/2023 11:33 AM

## 2023-01-19 ENCOUNTER — Other Ambulatory Visit: Payer: Self-pay | Admitting: Cardiology

## 2023-01-25 NOTE — Progress Notes (Unsigned)
Cardiology Office Note:    Date:  01/26/2023   ID:  Kenneth Pham, DOB May 16, 1956, MRN [id number]  PCP:  Kenneth Greenhouse, MD  Cardiologist:  Kenneth More, MD    Referring MD: Kenneth Greenhouse, MD    ASSESSMENT:    1. Chronic systolic (congestive) heart failure ([place])   2. Hypertensive heart disease with heart failure ([place])   3. CKD (chronic kidney disease) stage 2, GFR 60-89 ml/min   4. ICD (implantable cardioverter-defibrillator) in place   5. 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 Stable ICD therapy followed by device clinic Continue statin we will check a lipid profile today Interested in clinical trials in the future for heart failure   Next appointment: 6 months   Medication Adjustments/Labs and Tests Ordered: Current medicines are reviewed at length with the patient today.  Concerns regarding medicines are outlined above.  No orders of the defined types were placed in this encounter.  No orders of the defined types were placed in this encounter.   Chief Complaint  Patient presents with   Follow-up   Congestive Heart Failure    History of Present Illness:    Kenneth Pham is a 67 y.o. male with a hx of chronic systolic heart failure with hypertensive heart disease most recent ejection fraction 30 to 35% stage II CKD hyperlipidemia and ICD last seen 06/03/2022.  Compliance with diet, lifestyle and medications:  yes however he ran out of Milly Jakob is not taking  3 days he had potassiums as high as 6.5 associated with variable CKD with GFR's of 37  to 56 cc/min He is complaining of muscle weakness He is not having chest pain edema shortness of breath palpitation or syncope He walks daily and I asked him to get up to 20 minutes each day He does not snore severely he just has trouble sleeping at night Past Medical History:  Diagnosis Date   Acute CHF ([place]) 05/16/2019   Alcohol dependence in remission ([place]) 05/16/2019   Arteriosclerosis of carotid artery, right 05/15/2019   Bilateral carotid artery stenosis 05/15/2019   Cerebellar stroke, acute ([place]) 05/09/2019   Chronic anticoagulation 07/13/2019   CKD (chronic kidney disease) stage 2, GFR 60-89 ml/min 05/29/2019   Dilated cardiomyopathy ([place]) 05/29/2019   Heart failure with reduced ejection fraction ([place]) 05/09/2019   History of stroke involving cerebellum 05/09/2019   Hypercholesteremia    Hypertension    Hypertensive heart disease with heart failure ([place]) 05/29/2019   Nonsustained ventricular tachycardia ([place]) 06/19/2020   Formatting of this note might be different from the original. 2021: ICD   Precordial chest pain 08/21/2019   Prediabetes 06/08/2019   Formatting of this note might be different from the original. [facility] 05/29/2019: A1C 5.8%   Pulmonary nodules 05/15/2019   05/01/2019 CT angio - 8.67m bandlike nodular density lingula - 5.360mR middle lobe - 61.5 pack year hx, rec 3 month f/u   Shortness of breath 08/21/2019   Snores 05/29/2019   Stage 3a chronic kidney disease ([place]/14/2020   Viral respiratory infection 10/16/2021   Formatting  of this note might be different from the original. 10/16/2021: COVID negative    Past Surgical History:  Procedure Laterality Date   APPENDECTOMY     CATARACT EXTRACTION Right    ICD IMPLANT N/A 05/28/2020   Procedure: ICD IMPLANT;  Surgeon: Constance Haw, MD;  Location: [facility];  Service: Cardiovascular;  Laterality: N/A;   TONSILLECTOMY      Current Medications: Current Meds  Medication Sig   carvedilol (COREG) 25 MG tablet Take  1 tablet (25 mg total) by mouth 2 (two) times daily. Dose increase     Allergies:   Patient has no known allergies.   Social History   Socioeconomic History   Marital status: Widowed    Spouse name: Not on file   Number of children: Not on file   Years of education: Not on file   Highest education level: Not on file  Occupational History   Not on file  Tobacco Use   Smoking status: Former    Types: Cigarettes    Quit date: 04/29/2019    Years since quitting: 3.7   Smokeless tobacco: Never  Vaping Use   Vaping Use: Never used  Substance and Sexual Activity   Alcohol use: Not Currently   Drug use: Not Currently    Types: Marijuana   Sexual activity: Yes  Other Topics Concern   Not on file  Social History Narrative   Not on file   Social Determinants of Health   Financial Resource Strain: Not on file  Food Insecurity: Not on file  Transportation Needs: Not on file  Physical Activity: Not on file  Stress: Not on file  Social Connections: Not on file     Family History: The patient's family history includes Cancer in his father; Heart Problems in his brother; Stroke in his mother. ROS:   Please see the history of present illness.    All other systems reviewed and are negative.  EKGs/Labs/Other Studies Reviewed:    The following studies were reviewed today:  Cardiac Studies & Procedures     STRESS TESTS  MYOCARDIAL PERFUSION IMAGING 11/28/2019  Narrative  Nuclear stress EF: 33%.  The left ventricular ejection fraction is moderately decreased (30-44%).   ECHOCARDIOGRAM  ECHOCARDIOGRAM COMPLETE 08/02/2019  Narrative ECHOCARDIOGRAM REPORT    Patient Name:   Kenneth Pham Date of Exam: 08/01/2019 Medical Rec #:  [id number]    Height:       70.0 in Accession #:    [id number]   Weight:       178.0 lb Date of Birth:  1956/02/02    BSA:          1.99 m Patient Age:    85 years     BP:           132/82 mmHg Patient Gender: M            HR:           86  bpm. Exam Location:  [place]   Procedure: 2D Echo  Indications:     Chronic systolic heart failure ([place]) [I50.22] - Primary  History:         Patient has prior history of Echocardiogram examinations. Cardiomyopathy; Stroke Risk Factors:Hypertension and Dyslipidemia.  Sonographer:     Luane School Referring Phys:  [id number] Richardo Priest Diagnosing Phys: Godfrey Pick Tobb DO  IMPRESSIONS   1. The left ventricle has moderate-severely reduced systolic function, with an ejection fraction of 30-35%. There is diffuse  hypokinesis of the Left ventricle. There is mild concentric left ventricular hypertrophy. Left ventricular diastolic Doppler parameters are consistent with pseudonormalization. Elevated mean left atrial pressure. 2. There is abnormal global logitudinal strain (-7.5%). 3. The right ventricle has normal systolic function. 4. Left atrial and Right atrial size was normal. 5. Mild thickening of the aortic valve. Mild calcification of the aortic valve. Aortic valve regurgitation was not visualized by color flow Doppler.  FINDINGS Left Ventricle: The left ventricle has moderate-severely reduced systolic function, with an ejection fraction of 30-35%. The cavity size was normal. There is mild concentric left ventricular hypertrophy. Left ventricular diastolic Doppler parameters are consistent with pseudonormalization. Elevated mean left atrial pressure Left ventricular diffuse hypokinesis. There is abnormal global logitudinal strain (-7.5%).  Right Ventricle: The right ventricle has normal systolic function. The cavity was normal. There is no increase in right ventricular wall thickness.  Left Atrium: Left atrial size was normal in size.  Right Atrium: Right atrial size was normal in size  Interatrial Septum: No atrial level shunt detected by color flow Doppler. The atrial septum is grossly normal.  Pericardium: There is no evidence of pericardial effusion.  Mitral Valve: The mitral  valve is normal in structure. Mitral valve regurgitation is trivial by color flow Doppler. No evidence of mitral valve stenosis.  Tricuspid Valve: The tricuspid valve is normal in structure. Tricuspid valve regurgitation was not visualized by color flow Doppler.  Aortic Valve: The aortic valve is normal in structure. Mild thickening of the aortic valve. Mild calcification of the aortic valve. Aortic valve regurgitation was not visualized by color flow Doppler. There is no evidence of aortic valve stenosis.  Pulmonic Valve: The pulmonic valve was not well visualized. Pulmonic valve regurgitation is not visualized by color flow Doppler. No evidence of pulmonic stenosis.  Aorta: The aorta is normal unless otherwise noted.  Venous: The inferior vena cava measures 1.70 cm, is normal in size with greater than 50% respiratory variability, suggesting right atrial pressure of 3 mmHg.   +--------------+--------++ LEFT VENTRICLE              +----------------+---------++ +--------------+--------++      Diastology                PLAX 2D                     +----------------+---------++ +--------------+--------++      LV e' lateral:  4.13 cm/s LVIDd:        5.20 cm       +----------------+---------++ +--------------+--------++      LV E/e' lateral:15.0      LVIDs:        4.10 cm       +----------------+---------++ +--------------+--------++      LV e' medial:   3.05 cm/s LV PW:        1.10 cm       +----------------+---------++ +--------------+--------++      LV E/e' medial: 20.3      LV IVS:       1.20 cm       +----------------+---------++ +--------------+--------++ LVOT diam:    1.90 cm  +--------------+--------++ LV SV:        55 ml    +--------------+--------++ LV SV Index:  27.55    +--------------+--------++ LVOT Area:    2.84 cm +--------------+--------++                         +--------------+--------++  +------------------+---------++  LV Volumes (MOD)            +------------------+---------++ LV area d, A2C:   35.30 cm +------------------+---------++ LV area d, A4C:   32.20 cm +------------------+---------++ LV area s, A2C:   26.90 cm +------------------+---------++ LV area s, A4C:   25.60 cm +------------------+---------++ LV major d, A2C:  8.92 cm   +------------------+---------++ LV major d, A4C:  8.61 cm   +------------------+---------++ LV major s, A2C:  8.42 cm   +------------------+---------++ LV major s, A4C:  8.61 cm   +------------------+---------++ LV vol d, MOD A2C:116.0 ml  +------------------+---------++ LV vol d, MOD A4C:97.3 ml   +------------------+---------++ LV vol s, MOD A2C:72.4 ml   +------------------+---------++ LV vol s, MOD A4C:62.7 ml   +------------------+---------++ LV SV MOD A2C:    43.6 ml   +------------------+---------++ LV SV MOD A4C:    97.3 ml   +------------------+---------++ LV SV MOD BP:     40.4 ml   +------------------+---------++  +---------------+----------++ RIGHT VENTRICLE           +---------------+----------++ RV S prime:    10.30 cm/s +---------------+----------++ TAPSE (M-mode):1.3 cm     +---------------+----------++  +---------------+-------++-----------++ LEFT ATRIUM           Index       +---------------+-------++-----------++ LA diam:       4.20 cm2.11 cm/m  +---------------+-------++-----------++ LA Vol (A2C):  72.2 ml36.35 ml/m +---------------+-------++-----------++ LA Vol (A4C):  52.0 ml26.18 ml/m +---------------+-------++-----------++ LA Biplane Vol:61.2 ml30.81 ml/m +---------------+-------++-----------++ +------------+---------++-----------++ RIGHT ATRIUM         Index       +------------+---------++-----------++ RA Area:    12.50 cm             +------------+---------++-----------++ RA Volume:  28.80 ml 14.50 ml/m +------------+---------++-----------++ +------------+-----------++ AORTIC VALVE            +------------+-----------++ LVOT Vmax:  80.90 cm/s  +------------+-----------++ LVOT Vmean: 50.000 cm/s +------------+-----------++ LVOT VTI:   0.151 m     +------------+-----------++  +-------------+-------++ AORTA                +-------------+-------++ Ao Root diam:3.70 cm +-------------+-------++ Ao Asc diam: 3.40 cm +-------------+-------++  +--------------+----------++ MITRAL VALVE              +--------------+-------+ +--------------+----------++  SHUNTS                MV Area (PHT):4.86 cm    +--------------+-------+ +--------------+----------++  Systemic VTI: 0.15 m  MV PHT:       45.24 msec  +--------------+-------+ +--------------+----------++  Systemic Diam:1.90 cm MV Decel Time:156 msec    +--------------+-------+ +--------------+----------++ +--------------+-----------++ MV E velocity:61.80 cm/s  +--------------+-----------++ MV A velocity:113.00 cm/s +--------------+-----------++ MV E/A ratio: 0.55        +--------------+-----------++  +---------+-------+ IVC              +---------+-------+ IVC diam:1.70 cm +---------+-------+   Godfrey Pick Tobb DO Electronically signed by Berniece Salines DO Signature Date/Time: 08/02/2019/11:55:37 PM    Final    MONITORS  LONG TERM MONITOR (3-14 DAYS) 02/19/2020  Narrative A ZIO monitor was performed for 3 days and 23 hours beginning 02/04/2020 in order to assess arrhythmia associated with heart failure and CAD.  The rhythm throughout was sinus with minimum average and maximum heart rates of 51, 81 and 128 bpm.  There were no pauses of 3 seconds or greater and no episodes of second or third-degree AV nodal block or sinus node exit block.  Ventricular ectopy was occasional  PVCs and rare couplets and  triplets.  There were 11 runs of PVCs the fastest 12 complexes at a rate of 163 bpm and the longest 16.2 seconds at a relatively slow rate of 113 bpm.  Supraventricular ectopy was rare with no episodes of atrial fibrillation or flutter.  There was one symptomatic event with chest pain and palpitation associated with ventricular ectopy with PVCs and couplets.  There were 2 triggered events associated with ventricular and supraventricular ectopy with PVCs and APCs.   Conclusion, ventricular ectopy with occasional PVCs rare couplets and triplets and around 11 runs of PVCs present.          His ICD is followed by device clinic normal device parameters as well as OptiVol is noted to have brief ventricular arrhythmias.  He has had no VT or VF therapy   Recent Labs: 02/01/2022: NT-Pro BNP 1,098 06/03/2022: ALT 29 06/14/2022: BUN 18; Creatinine, Ser 1.39; Potassium 5.7; Sodium 134  Recent Lipid Panel    Component Value Date/Time   CHOL 143 06/03/2022 1300   TRIG 50 06/03/2022 1300   HDL 64 06/03/2022 1300   CHOLHDL 2.2 06/03/2022 1300   LDLCALC 68 06/03/2022 1300    Physical Exam:    VS:  BP 110/70 (BP Location: Left Arm, Patient Position: Sitting, Cuff Size: Normal)   Pulse 70   Ht '5\' 10"'$  (1.778 m)   Wt 171 lb (77.6 kg)   SpO2 98%   BMI 24.54 kg/m     Wt Readings from Last 3 Encounters:  01/26/23 171 lb (77.6 kg)  12/20/22 172 lb (78 kg)  06/03/22 166 lb (75.3 kg)     GEN:  Well nourished, well developed in no acute distress HEENT: Normal NECK: No JVD; No carotid bruits LYMPHATICS: No lymphadenopathy CARDIAC: RRR, no murmurs, rubs, gallops RESPIRATORY:  Clear to auscultation without rales, wheezing or rhonchi  ABDOMEN: Soft, non-tender, non-distended MUSCULOSKELETAL:  No edema; No deformity  SKIN: Warm and dry NEUROLOGIC:  Alert and oriented x 3 PSYCHIATRIC:  Normal affect    Signed, Kenneth More, MD  01/26/2023 11:40 AM    [place]

## 2023-01-26 ENCOUNTER — Ambulatory Visit: Payer: 59 | Attending: Cardiology | Admitting: Cardiology

## 2023-01-26 ENCOUNTER — Encounter: Payer: Self-pay | Admitting: Cardiology

## 2023-01-26 VITALS — BP 110/70 | HR 70 | Ht 70.0 in | Wt 171.0 lb

## 2023-01-26 DIAGNOSIS — I5022 Chronic systolic (congestive) heart failure: Secondary | ICD-10-CM | POA: Diagnosis not present

## 2023-01-26 DIAGNOSIS — Z95810 Presence of automatic (implantable) cardiac defibrillator: Secondary | ICD-10-CM | POA: Diagnosis not present

## 2023-01-26 DIAGNOSIS — N182 Chronic kidney disease, stage 2 (mild): Secondary | ICD-10-CM

## 2023-01-26 DIAGNOSIS — I110 Hypertensive heart disease with heart failure: Secondary | ICD-10-CM

## 2023-01-26 DIAGNOSIS — E7800 Pure hypercholesterolemia, unspecified: Secondary | ICD-10-CM

## 2023-01-26 MED ORDER — CARVEDILOL 25 MG PO TABS: 25.0000 mg | ORAL_TABLET | Freq: Two times a day (BID) | ORAL | 3 refills | Status: DC

## 2023-01-26 MED ORDER — LOKELMA 10 G PO PACK: 10.0000 g | PACK | ORAL | 3 refills | Status: DC

## 2023-01-26 NOTE — Patient Instructions (Signed)
Medication Instructions:  Your physician has recommended you make the following change in your medication:   START: Lokelma 10 g two times weekly  *If you need a refill on your cardiac medications before your next appointment, please call your pharmacy*   Lab Work: Your physician recommends that you return for lab work in:   Labs today: CMP, Lipids, Pro BNP  If you have labs (blood work) drawn today and your tests are completely normal, you will receive your results only by: MyChart Message (if you have MyChart) OR A paper copy in the mail If you have any lab test that is abnormal or we need to change your treatment, we will call you to review the results.   Testing/Procedures: None   Follow-Up: At [facility], you and your health needs are our priority.  As part of our continuing mission to provide you with exceptional heart care, we have created designated Provider Care Teams.  These Care Teams include your primary Cardiologist (physician) and Advanced Practice Providers (APPs -  Physician Assistants and Nurse Practitioners) who all work together to provide you with the care you need, when you need it.  We recommend signing up for the patient portal called "MyChart".  Sign up information is provided on this After Visit Summary.  MyChart is used to connect with patients for Virtual Visits (Telemedicine).  Patients are able to view lab/test results, encounter notes, upcoming appointments, etc.  Non-urgent messages can be sent to your provider as well.   To learn more about what you can do with MyChart, go to [email or web address].    Your next appointment:   6 month(s)  Provider:   Shirlee More, MD    Other Instructions Goal of 20 minutes of walking per day.

## 2023-01-27 ENCOUNTER — Other Ambulatory Visit: Payer: Self-pay | Admitting: Cardiology

## 2023-01-27 LAB — LIPID PANEL
Chol/HDL Ratio: 3.7 ratio (ref 0.0–5.0)
Cholesterol, Total: 190 mg/dL (ref 100–199)
HDL: 52 mg/dL (ref >39)
LDL Chol Calc (NIH): 113 mg/dL — ABNORMAL HIGH (ref 0–99)
Triglycerides: 143 mg/dL (ref 0–149)
VLDL Cholesterol Cal: 25 mg/dL (ref 5–40)

## 2023-01-27 LAB — COMPREHENSIVE METABOLIC PANEL
ALT: 14 IU/L (ref 0–44)
AST: 20 IU/L (ref 0–40)
Albumin/Globulin Ratio: 1.8 (ref 1.2–2.2)
Albumin: 4.6 g/dL (ref 3.9–4.9)
Alkaline Phosphatase: 64 IU/L (ref 44–121)
BUN/Creatinine Ratio: 15 (ref 10–24)
BUN: 23 mg/dL (ref 8–27)
Bilirubin Total: <0.2 mg/dL (ref 0.0–1.2)
CO2: 19 mmol/L — ABNORMAL LOW (ref 20–29)
Calcium: 9.5 mg/dL (ref 8.6–10.2)
Chloride: 106 mmol/L (ref 96–106)
Creatinine, Ser: 1.56 mg/dL — ABNORMAL HIGH (ref 0.76–1.27)
Globulin, Total: 2.6 g/dL (ref 1.5–4.5)
Glucose: 104 mg/dL — ABNORMAL HIGH (ref 70–99)
Potassium: 4.9 mmol/L (ref 3.5–5.2)
Sodium: 140 mmol/L (ref 134–144)
Total Protein: 7.2 g/dL (ref 6.0–8.5)
eGFR: 49 mL/min/{1.73_m2} — ABNORMAL LOW (ref >59)

## 2023-01-27 LAB — PRO B NATRIURETIC PEPTIDE: NT-Pro BNP: 1494 pg/mL — ABNORMAL HIGH (ref 0–376)

## 2023-01-27 NOTE — Telephone Encounter (Signed)
Entresto 97 mg-103 mg tablets # 60 x 11 refills sent to pharmacy

## 2023-01-28 ENCOUNTER — Telehealth: Payer: Self-pay | Admitting: Cardiology

## 2023-01-28 NOTE — Telephone Encounter (Signed)
Patient returning call for lab results. 

## 2023-01-28 NOTE — Telephone Encounter (Signed)
Patient informed of results.  

## 2023-02-21 ENCOUNTER — Ambulatory Visit: Payer: 59 | Attending: Cardiology

## 2023-02-21 DIAGNOSIS — Z95810 Presence of automatic (implantable) cardiac defibrillator: Secondary | ICD-10-CM

## 2023-02-21 DIAGNOSIS — I5022 Chronic systolic (congestive) heart failure: Secondary | ICD-10-CM

## 2023-02-23 NOTE — Progress Notes (Signed)
EPIC Encounter for ICM Monitoring  [name] is a 67 y.o. male Date: 02/23/2023 Primary Care Physican: Olive Bass, MD Primary Cardiologist: [facility]                                             Electrophysiologist: Elberta Fortis 01/18/2023 Weight: 173 lbs (baseline 173-175 lbs)                                                            Spoke with patient and heart failure questions reviewed.  Transmission results reviewed.  Pt asymptomatic for fluid accumulation.  Reports having a stomach virus today but everything else is fine.     Optivol thoracic impedance suggesting intermittent days with possible fluid accumulation within the last month.    Prescribed:  Furosemide 40 mg take 1 tablet(s) (40 mg total) by mouth as needed (take only when weight goes up 3 lbs from baseline).   Labs: 09/09/2022 Creatinine 1.48, BUN 22, Potassium 5.0, Sodium 138, GFR 52  A complete set of results can be found in Results Review.   Recommendations:  No changes and encouraged to call if experiencing any fluid symptoms.   Follow-up plan: ICM clinic phone appointment on 03/28/2023.   91 day device clinic remote transmission 05/26/2023.     EP/Cardiology Office Visits:  Recall 07/25/2023 with Dr. Dulce Sellar.  Recall 12/15/2023 with Dr Elberta Fortis.   Copy of ICM check sent to Dr. Elberta Fortis.    3 month ICM trend: 02/21/2023.    12-14 Month ICM trend:     Karie Soda, RN 02/23/2023 9:06 AM

## 2023-02-24 ENCOUNTER — Ambulatory Visit (INDEPENDENT_AMBULATORY_CARE_PROVIDER_SITE_OTHER): Payer: 59

## 2023-02-24 DIAGNOSIS — I420 Dilated cardiomyopathy: Secondary | ICD-10-CM

## 2023-02-24 LAB — CUP PACEART REMOTE DEVICE CHECK
Battery Remaining Longevity: 119 mo
Battery Voltage: 3.01 V
Brady Statistic RV Percent Paced: 0.01 %
Date Time Interrogation Session: 20240411063425
HighPow Impedance: 70 Ohm
Implantable Lead Connection Status: 753985
Implantable Lead Implant Date: 20210714
Implantable Lead Location: 753860
Implantable Pulse Generator Implant Date: 20210714
Lead Channel Impedance Value: 437 Ohm
Lead Channel Impedance Value: 532 Ohm
Lead Channel Pacing Threshold Amplitude: 0.5 V
Lead Channel Pacing Threshold Pulse Width: 0.4 ms
Lead Channel Sensing Intrinsic Amplitude: 11.25 mV
Lead Channel Sensing Intrinsic Amplitude: 11.25 mV
Lead Channel Setting Pacing Amplitude: 2 V
Lead Channel Setting Pacing Pulse Width: 0.4 ms
Lead Channel Setting Sensing Sensitivity: 0.3 mV
Zone Setting Status: 755011
Zone Setting Status: 755011

## 2023-03-28 ENCOUNTER — Ambulatory Visit: Payer: 59 | Attending: Cardiology

## 2023-03-28 ENCOUNTER — Other Ambulatory Visit: Payer: Self-pay | Admitting: Cardiology

## 2023-03-28 DIAGNOSIS — I5022 Chronic systolic (congestive) heart failure: Secondary | ICD-10-CM | POA: Diagnosis not present

## 2023-03-28 DIAGNOSIS — Z95810 Presence of automatic (implantable) cardiac defibrillator: Secondary | ICD-10-CM | POA: Diagnosis not present

## 2023-03-31 NOTE — Progress Notes (Signed)
Remote ICD transmission.   

## 2023-04-01 NOTE — Progress Notes (Signed)
EPIC Encounter for ICM Monitoring  [name] is a 67 y.o. male Date: 04/01/2023 Primary Care Physican: Olive Bass, MD Primary Cardiologist: [facility]                                             Electrophysiologist: Elberta Fortis 01/18/2023 Weight: 173 lbs (baseline 173-175 lbs) 04/01/2023 Weight: 171 lbs                                                            Spoke with patient and heart failure questions reviewed.  Transmission results reviewed.  Pt asymptomatic for fluid accumulation.  Reports feeling well at this time and voices no complaints.  He has taken PRN Furosemide twice in the last month for swollen feet.    Optivol thoracic impedance suggesting normal fluid levels within the last month.    Prescribed:  Furosemide 40 mg take 1 tablet(s) (40 mg total) by mouth as needed (take only when weight goes up 3 lbs from baseline).   Labs: 09/09/2022 Creatinine 1.48, BUN 22, Potassium 5.0, Sodium 138, GFR 52  A complete set of results can be found in Results Review.   Recommendations:  No changes and encouraged to call if experiencing any fluid symptoms.   Follow-up plan: ICM clinic phone appointment on 05/02/2023.   91 day device clinic remote transmission 05/26/2023.     EP/Cardiology Office Visits:  Recall 07/25/2023 with Dr. Dulce Sellar.  Recall 12/15/2023 with Dr Elberta Fortis.   Copy of ICM check sent to Dr. Elberta Fortis.   3 month ICM trend: 03/28/2023.    12-14 Month ICM trend:     Karie Soda, RN 04/01/2023 8:14 AM

## 2023-04-19 ENCOUNTER — Other Ambulatory Visit: Payer: Self-pay | Admitting: Cardiology

## 2023-05-02 ENCOUNTER — Ambulatory Visit: Payer: 59 | Attending: Cardiology

## 2023-05-02 DIAGNOSIS — I5022 Chronic systolic (congestive) heart failure: Secondary | ICD-10-CM

## 2023-05-02 DIAGNOSIS — Z95810 Presence of automatic (implantable) cardiac defibrillator: Secondary | ICD-10-CM | POA: Diagnosis not present

## 2023-05-06 NOTE — Progress Notes (Signed)
EPIC Encounter for ICM Monitoring  [name] is a 67 y.o. male Date: 05/06/2023 Primary Care Physican: Olive Bass, MD Primary Cardiologist: [facility]                                             Electrophysiologist: Elberta Fortis 01/18/2023 Weight: 173 lbs (baseline 173-175 lbs) 04/01/2023 Weight: 171 lbs 05/06/2023 Weight: 171-172 lbs                                                            Spoke with patient and heart failure questions reviewed.  Transmission results reviewed.  Pt currently asymptomatic for fluid accumulation.  Reports feeling well at this time and voices no complaints.  He typically takes PRN Lasix a couple of times a month for swollen feet.    Optivol thoracic impedance suggesting normal fluid levels within the last month.    Prescribed:  Furosemide 40 mg take 1 tablet(s) (40 mg total) by mouth as needed (take only when weight goes up 3 lbs from baseline).   Labs: 01/26/2023 Creatinine 1.56, BUN 23, Potassium 4.9, Sodium 140, GFR 49 A complete set of results can be found in Results Review.   Recommendations:  No changes and encouraged to call if experiencing any fluid symptoms.   Follow-up plan: ICM clinic phone appointment on 06/06/2023.   91 day device clinic remote transmission 05/26/2023.     EP/Cardiology Office Visits:  Recall 07/25/2023 with Dr. Dulce Sellar.  Recall 12/15/2023 with Dr Elberta Fortis.   Copy of ICM check sent to Dr. Elberta Fortis.    3 month ICM trend: 05/02/2023.    12-14 Month ICM trend:     Karie Soda, RN 05/06/2023 10:27 AM

## 2023-05-15 ENCOUNTER — Other Ambulatory Visit: Payer: Self-pay | Admitting: Cardiology

## 2023-05-26 ENCOUNTER — Ambulatory Visit (INDEPENDENT_AMBULATORY_CARE_PROVIDER_SITE_OTHER): Payer: 59

## 2023-05-26 DIAGNOSIS — I420 Dilated cardiomyopathy: Secondary | ICD-10-CM

## 2023-05-26 LAB — CUP PACEART REMOTE DEVICE CHECK
Battery Remaining Longevity: 117 mo
Battery Voltage: 3.03 V
Brady Statistic RV Percent Paced: 0.04 %
Date Time Interrogation Session: 20240711043725
HighPow Impedance: 70 Ohm
Implantable Lead Connection Status: 753985
Implantable Lead Implant Date: 20210714
Implantable Lead Location: 753860
Implantable Pulse Generator Implant Date: 20210714
Lead Channel Impedance Value: 437 Ohm
Lead Channel Impedance Value: 570 Ohm
Lead Channel Pacing Threshold Amplitude: 0.5 V
Lead Channel Pacing Threshold Pulse Width: 0.4 ms
Lead Channel Sensing Intrinsic Amplitude: 9.875 mV
Lead Channel Sensing Intrinsic Amplitude: 9.875 mV
Lead Channel Setting Pacing Amplitude: 2 V
Lead Channel Setting Pacing Pulse Width: 0.4 ms
Lead Channel Setting Sensing Sensitivity: 0.3 mV
Zone Setting Status: 755011
Zone Setting Status: 755011

## 2023-06-06 ENCOUNTER — Ambulatory Visit: Payer: 59 | Attending: Cardiology

## 2023-06-06 DIAGNOSIS — Z95810 Presence of automatic (implantable) cardiac defibrillator: Secondary | ICD-10-CM | POA: Diagnosis not present

## 2023-06-06 DIAGNOSIS — I5022 Chronic systolic (congestive) heart failure: Secondary | ICD-10-CM

## 2023-06-06 NOTE — Progress Notes (Signed)
EPIC Encounter for ICM Monitoring  [name] is a 67 y.o. male Date: 06/06/2023 Primary Care Physican: Olive Bass, MD Primary Cardiologist: [facility]                                             Electrophysiologist: Elberta Fortis 01/18/2023 Weight: 173 lbs (baseline 173-175 lbs) 04/01/2023 Weight: 171 lbs 05/06/2023 Weight: 171-172 lbs 06/06/2023 Weight: 171 lbs                                                            Spoke with patient and heart failure questions reviewed.  Transmission results reviewed.  Pt currently asymptomatic for fluid accumulation but has occasional swelling of his feet.   He did stumble yesterday and little off balance.  Encouraged to call PCP if balance issue occurs or worsens.    Optivol thoracic impedance suggesting possible fluid accumulation starting 7/18.    Prescribed:  Furosemide 40 mg take 1 tablet(s) (40 mg total) by mouth as needed (take only when weight goes up 3 lbs from baseline).   Labs: 01/26/2023 Creatinine 1.56, BUN 23, Potassium 4.9, Sodium 140, GFR 49 A complete set of results can be found in Results Review.   Recommendations:  Advised to take 1 PRN Lasix x 1 dose.    Follow-up plan: ICM clinic phone appointment on 07/11/2023.   91 day device clinic remote transmission 08/25/2023.     EP/Cardiology Office Visits:  Recall 07/25/2023 with Dr. Dulce Sellar.  Recall 12/15/2023 with Dr Elberta Fortis.   Copy of ICM check sent to Dr. Elberta Fortis.    3 month ICM trend: 06/06/2023.    12-14 Month ICM trend:     Karie Soda, RN 06/06/2023 1:57 PM

## 2023-06-14 NOTE — Progress Notes (Signed)
Remote ICD transmission.   

## 2023-07-11 ENCOUNTER — Ambulatory Visit: Payer: 59 | Attending: Cardiology

## 2023-07-11 DIAGNOSIS — I5022 Chronic systolic (congestive) heart failure: Secondary | ICD-10-CM | POA: Diagnosis not present

## 2023-07-11 DIAGNOSIS — Z95810 Presence of automatic (implantable) cardiac defibrillator: Secondary | ICD-10-CM

## 2023-07-14 NOTE — Progress Notes (Signed)
EPIC Encounter for ICM Monitoring  [name] is a 67 y.o. male Date: 07/14/2023 Primary Care Physican: Olive Bass, MD Primary Cardiologist: [facility]                                             Electrophysiologist: Elberta Fortis 01/18/2023 Weight: 173 lbs (baseline 173-175 lbs) 04/01/2023 Weight: 171 lbs 05/06/2023 Weight: 171-172 lbs 06/06/2023 Weight: 171 lbs 07/14/2023 Weight: 171 lbs                                                            Spoke with patient and heart failure questions reviewed.  Transmission results reviewed.  Pt asymptomatic for fluid accumulation. He does have intermittent swelling of his feet.   Reports feeling well at this time and voices no complaints.  Took PRN Lasix twice this past month.   Optivol thoracic impedance suggesting normal fluid levels.    Prescribed:  Furosemide 40 mg take 1 tablet(s) (40 mg total) by mouth as needed (take only when weight goes up 3 lbs from baseline).   Labs: 01/26/2023 Creatinine 1.56, BUN 23, Potassium 4.9, Sodium 140, GFR 49 A complete set of results can be found in Results Review.   Recommendations:  No changes and encouraged to call if experiencing any fluid symptoms.   Follow-up plan: ICM clinic phone appointment on 08/22/2023.   91 day device clinic remote transmission 08/25/2023.     EP/Cardiology Office Visits:  Recall 07/25/2023 with Dr. Dulce Sellar.  Recall 12/15/2023 with Dr Elberta Fortis.   Copy of ICM check sent to Dr. Elberta Fortis.    3 month ICM trend: 07/11/2023.    12-14 Month ICM trend:     Karie Soda, RN 07/14/2023 12:32 PM

## 2023-08-18 ENCOUNTER — Other Ambulatory Visit: Payer: Self-pay | Admitting: Cardiology

## 2023-08-22 ENCOUNTER — Ambulatory Visit: Payer: 59 | Attending: Cardiology

## 2023-08-22 DIAGNOSIS — I5022 Chronic systolic (congestive) heart failure: Secondary | ICD-10-CM | POA: Diagnosis not present

## 2023-08-22 DIAGNOSIS — Z95810 Presence of automatic (implantable) cardiac defibrillator: Secondary | ICD-10-CM

## 2023-08-23 NOTE — Progress Notes (Unsigned)
Cardiology Office Note:    Date:  08/24/2023   ID:  Kenneth Pham, DOB Jun 11, 1956, MRN [id number]  PCP:  Olive Bass, MD  Cardiologist:  Norman Herrlich, MD    Referring MD: Olive Bass, MD    ASSESSMENT:    1. Chronic systolic (congestive) heart failure (HCC)   2. ICD (implantable cardioverter-defibrillator) in place   3. Hypertensive heart disease with heart failure (HCC)   4. CKD (chronic kidney disease) stage 2, GFR 60-89 ml/min   5. Hypercholesteremia   6. Hyperkalemia    PLAN:    In order of problems listed above:  Rahm continues to do well and got Directed therapy he will continue his current carvedilol SGLT2 inhibitor loop diuretic and Entresto. Not on spironolactone with his CKD and hyperkalemia BP is at target on guideline directed treatment Will recheck his renal function potassium today and continue Lokelma Continue with statin   Next appointment: 6 months and I will continue to follow in our device clinic   Medication Adjustments/Labs and Tests Ordered: Current medicines are reviewed at length with the patient today.  Concerns regarding medicines are outlined above.  Orders Placed This Encounter  Procedures   Comp Met (CMET)   Lipid Profile   CBC   No orders of the defined types were placed in this encounter.    History of Present Illness:    Kenneth Pham is a 67 y.o. male with a hx of chronic systolic heart failure with hypertensive heart disease most recent ejection fraction 30 to 35% stage II CKD hyperlipidemia ICD and hyperkalemia requiring resin binder therapy to maintain on Entresto last seen 01/26/2023.  Compliance with diet, lifestyle and medications: Yes  Overall he is pleased with the quality of his life he remains active but he finds if he walks for longer distance or uphill he is breathless not severe or limiting the pattern has not changed and is not having edema orthopnea. He continues taking potassium binder so he can tolerate his  Entresto with CKD he will need labs today including potassium renal function lipids and liver function test and a CBC. He follows in our device clinic with normal device function his last check had 1 short episode of nonsustained VT he did not require VT VF therapy Past Medical History:  Diagnosis Date   Acute CHF (HCC) 05/16/2019   Alcohol dependence in remission (HCC) 05/16/2019   Arteriosclerosis of carotid artery, right 05/15/2019   Bilateral carotid artery stenosis 05/15/2019   Cerebellar stroke, acute (HCC) 05/09/2019   Chronic anticoagulation 07/13/2019   CKD (chronic kidney disease) stage 2, GFR 60-89 ml/min 05/29/2019   Dilated cardiomyopathy (HCC) 05/29/2019   Heart failure with reduced ejection fraction (HCC) 05/09/2019   History of stroke involving cerebellum 05/09/2019   Hypercholesteremia    Hypertension    Hypertensive heart disease with heart failure (HCC) 05/29/2019   Nonsustained ventricular tachycardia (HCC) 06/19/2020   Formatting of this note might be different from the original. 2021: ICD   Precordial chest pain 08/21/2019   Prediabetes 06/08/2019   Formatting of this note might be different from the original. [facility] 05/29/2019: A1C 5.8%   Pulmonary nodules 05/15/2019   05/01/2019 CT angio - 8.59mm bandlike nodular density lingula - 5.75mm R middle lobe - 61.5 pack year hx, rec 3 month f/u   Shortness of breath 08/21/2019   Snores 05/29/2019   Stage 3a chronic kidney disease (HCC) 05/29/2019   Viral respiratory infection 10/16/2021   Formatting of this  note might be different from the original. 10/16/2021: COVID negative    Current Medications: Current Meds  Medication Sig   albuterol (VENTOLIN HFA) 108 (90 Base) MCG/ACT inhaler TAKE 2 PUFFS BY MOUTH EVERY 6 HOURS AS NEEDED FOR WHEEZE OR SHORTNESS OF BREATH (Patient taking differently: Inhale 2 puffs into the lungs every 6 (six) hours as needed for wheezing or shortness of breath.)   aspirin EC 81 MG tablet Take 81 mg by mouth daily.     atorvastatin (LIPITOR) 80 MG tablet TAKE 1 TABLET BY MOUTH EVERY DAY (Patient taking differently: Take 80 mg by mouth daily. TAKE 1 TABLET BY MOUTH EVERY DAY)   carvedilol (COREG) 25 MG tablet Take 1 tablet (25 mg total) by mouth 2 (two) times daily. Dose increase   dapagliflozin propanediol (FARXIGA) 10 MG TABS tablet TAKE 1 TABLET BY MOUTH EVERY DAY BEFORE BREAKFAST (Patient taking differently: Take 10 mg by mouth daily.)   ENTRESTO 97-103 MG TAKE 1 TABLET BY MOUTH TWICE A DAY (Patient taking differently: Take 1 tablet by mouth 2 (two) times daily.)   furosemide (LASIX) 40 MG tablet Take 1 tablet (40 mg total) by mouth daily as needed (for weight gain 3 pounds above baseline). (Patient taking differently: Take 40 mg by mouth daily as needed for fluid or edema (for weight gain 3 pounds above baseline).)   prednisoLONE acetate (PRED FORTE) 1 % ophthalmic suspension Place 1 drop into the right eye 3 (three) times daily.   sodium zirconium cyclosilicate (LOKELMA) 10 g PACK packet Take 10 g by mouth 2 (two) times a week.      EKGs/Labs/Other Studies Reviewed:    The following studies were reviewed today:  Cardiac Studies & Procedures     STRESS TESTS  MYOCARDIAL PERFUSION IMAGING 11/28/2019  Narrative  Nuclear stress EF: 33%.  The left ventricular ejection fraction is moderately decreased (30-44%).   ECHOCARDIOGRAM  ECHOCARDIOGRAM COMPLETE 08/01/2019  Narrative ECHOCARDIOGRAM REPORT    Patient Name:   Kenneth Pham Date of Exam: 08/01/2019 Medical Rec #:  [id number]    Height:       70.0 in Accession #:    [id number]   Weight:       178.0 lb Date of Birth:  20-Nov-1955    BSA:          1.99 m Patient Age:    63 years     BP:           132/82 mmHg Patient Gender: M            HR:           86 bpm. Exam Location:  [facility]   Procedure: 2D Echo  Indications:     Chronic systolic heart failure (HCC) [I50.22] - Primary  History:         Patient has prior history of Echocardiogram  examinations. Cardiomyopathy; Stroke Risk Factors:Hypertension and Dyslipidemia.  Sonographer:     Louie Boston Referring Phys:  [id number] Baldo Daub Diagnosing Phys: Lavona [place] Tobb DO  IMPRESSIONS   1. The left ventricle has moderate-severely reduced systolic function, with an ejection fraction of 30-35%. There is diffuse hypokinesis of the Left ventricle. There is mild concentric left ventricular hypertrophy. Left ventricular diastolic Doppler parameters are consistent with pseudonormalization. Elevated mean left atrial pressure. 2. There is abnormal global logitudinal strain (-7.5%). 3. The right ventricle has normal systolic function. 4. Left atrial and Right atrial size was normal. 5. Mild thickening of the aortic valve. Mild  note might be different from the original. 10/16/2021: COVID negative    Current Medications: Current Meds  Medication Sig   albuterol (VENTOLIN HFA) 108 (90 Base) MCG/ACT inhaler TAKE 2 PUFFS BY MOUTH EVERY 6 HOURS AS NEEDED FOR WHEEZE OR SHORTNESS OF BREATH (Patient taking differently: Inhale 2 puffs into the lungs every 6 (six) hours as needed for wheezing or shortness of breath.)   aspirin EC 81 MG tablet Take 81 mg by mouth daily.     atorvastatin (LIPITOR) 80 MG tablet TAKE 1 TABLET BY MOUTH EVERY DAY (Patient taking differently: Take 80 mg by mouth daily. TAKE 1 TABLET BY MOUTH EVERY DAY)   carvedilol (COREG) 25 MG tablet Take 1 tablet (25 mg total) by mouth 2 (two) times daily. Dose increase   dapagliflozin propanediol (FARXIGA) 10 MG TABS tablet TAKE 1 TABLET BY MOUTH EVERY DAY BEFORE BREAKFAST (Patient taking differently: Take 10 mg by mouth daily.)   ENTRESTO 97-103 MG TAKE 1 TABLET BY MOUTH TWICE A DAY (Patient taking differently: Take 1 tablet by mouth 2 (two) times daily.)   furosemide (LASIX) 40 MG tablet Take 1 tablet (40 mg total) by mouth daily as needed (for weight gain 3 pounds above baseline). (Patient taking differently: Take 40 mg by mouth daily as needed for fluid or edema (for weight gain 3 pounds above baseline).)   prednisoLONE acetate (PRED FORTE) 1 % ophthalmic suspension Place 1 drop into the right eye 3 (three) times daily.   sodium zirconium cyclosilicate (LOKELMA) 10 g PACK packet Take 10 g by mouth 2 (two) times a week.      EKGs/Labs/Other Studies Reviewed:    The following studies were reviewed today:  Cardiac Studies & Procedures     STRESS TESTS  MYOCARDIAL PERFUSION IMAGING 11/28/2019  Narrative  Nuclear stress EF: 33%.  The left ventricular ejection fraction is moderately decreased (30-44%).   ECHOCARDIOGRAM  ECHOCARDIOGRAM COMPLETE 08/01/2019  Narrative ECHOCARDIOGRAM REPORT    Patient Name:   Kenneth Pham Date of Exam: 08/01/2019 Medical Rec #:  [id number]    Height:       70.0 in Accession #:    [id number]   Weight:       178.0 lb Date of Birth:  20-Nov-1955    BSA:          1.99 m Patient Age:    63 years     BP:           132/82 mmHg Patient Gender: M            HR:           86 bpm. Exam Location:  [facility]   Procedure: 2D Echo  Indications:     Chronic systolic heart failure (HCC) [I50.22] - Primary  History:         Patient has prior history of Echocardiogram  examinations. Cardiomyopathy; Stroke Risk Factors:Hypertension and Dyslipidemia.  Sonographer:     Louie Boston Referring Phys:  [id number] Baldo Daub Diagnosing Phys: Lavona [place] Tobb DO  IMPRESSIONS   1. The left ventricle has moderate-severely reduced systolic function, with an ejection fraction of 30-35%. There is diffuse hypokinesis of the Left ventricle. There is mild concentric left ventricular hypertrophy. Left ventricular diastolic Doppler parameters are consistent with pseudonormalization. Elevated mean left atrial pressure. 2. There is abnormal global logitudinal strain (-7.5%). 3. The right ventricle has normal systolic function. 4. Left atrial and Right atrial size was normal. 5. Mild thickening of the aortic valve. Mild  Cardiology Office Note:    Date:  08/24/2023   ID:  Kenneth Pham, DOB Jun 11, 1956, MRN [id number]  PCP:  Olive Bass, MD  Cardiologist:  Norman Herrlich, MD    Referring MD: Olive Bass, MD    ASSESSMENT:    1. Chronic systolic (congestive) heart failure (HCC)   2. ICD (implantable cardioverter-defibrillator) in place   3. Hypertensive heart disease with heart failure (HCC)   4. CKD (chronic kidney disease) stage 2, GFR 60-89 ml/min   5. Hypercholesteremia   6. Hyperkalemia    PLAN:    In order of problems listed above:  Rahm continues to do well and got Directed therapy he will continue his current carvedilol SGLT2 inhibitor loop diuretic and Entresto. Not on spironolactone with his CKD and hyperkalemia BP is at target on guideline directed treatment Will recheck his renal function potassium today and continue Lokelma Continue with statin   Next appointment: 6 months and I will continue to follow in our device clinic   Medication Adjustments/Labs and Tests Ordered: Current medicines are reviewed at length with the patient today.  Concerns regarding medicines are outlined above.  Orders Placed This Encounter  Procedures   Comp Met (CMET)   Lipid Profile   CBC   No orders of the defined types were placed in this encounter.    History of Present Illness:    Kenneth Pham is a 67 y.o. male with a hx of chronic systolic heart failure with hypertensive heart disease most recent ejection fraction 30 to 35% stage II CKD hyperlipidemia ICD and hyperkalemia requiring resin binder therapy to maintain on Entresto last seen 01/26/2023.  Compliance with diet, lifestyle and medications: Yes  Overall he is pleased with the quality of his life he remains active but he finds if he walks for longer distance or uphill he is breathless not severe or limiting the pattern has not changed and is not having edema orthopnea. He continues taking potassium binder so he can tolerate his  Entresto with CKD he will need labs today including potassium renal function lipids and liver function test and a CBC. He follows in our device clinic with normal device function his last check had 1 short episode of nonsustained VT he did not require VT VF therapy Past Medical History:  Diagnosis Date   Acute CHF (HCC) 05/16/2019   Alcohol dependence in remission (HCC) 05/16/2019   Arteriosclerosis of carotid artery, right 05/15/2019   Bilateral carotid artery stenosis 05/15/2019   Cerebellar stroke, acute (HCC) 05/09/2019   Chronic anticoagulation 07/13/2019   CKD (chronic kidney disease) stage 2, GFR 60-89 ml/min 05/29/2019   Dilated cardiomyopathy (HCC) 05/29/2019   Heart failure with reduced ejection fraction (HCC) 05/09/2019   History of stroke involving cerebellum 05/09/2019   Hypercholesteremia    Hypertension    Hypertensive heart disease with heart failure (HCC) 05/29/2019   Nonsustained ventricular tachycardia (HCC) 06/19/2020   Formatting of this note might be different from the original. 2021: ICD   Precordial chest pain 08/21/2019   Prediabetes 06/08/2019   Formatting of this note might be different from the original. [facility] 05/29/2019: A1C 5.8%   Pulmonary nodules 05/15/2019   05/01/2019 CT angio - 8.59mm bandlike nodular density lingula - 5.75mm R middle lobe - 61.5 pack year hx, rec 3 month f/u   Shortness of breath 08/21/2019   Snores 05/29/2019   Stage 3a chronic kidney disease (HCC) 05/29/2019   Viral respiratory infection 10/16/2021   Formatting of this  Cardiology Office Note:    Date:  08/24/2023   ID:  Kenneth Pham, DOB Jun 11, 1956, MRN [id number]  PCP:  Olive Bass, MD  Cardiologist:  Norman Herrlich, MD    Referring MD: Olive Bass, MD    ASSESSMENT:    1. Chronic systolic (congestive) heart failure (HCC)   2. ICD (implantable cardioverter-defibrillator) in place   3. Hypertensive heart disease with heart failure (HCC)   4. CKD (chronic kidney disease) stage 2, GFR 60-89 ml/min   5. Hypercholesteremia   6. Hyperkalemia    PLAN:    In order of problems listed above:  Rahm continues to do well and got Directed therapy he will continue his current carvedilol SGLT2 inhibitor loop diuretic and Entresto. Not on spironolactone with his CKD and hyperkalemia BP is at target on guideline directed treatment Will recheck his renal function potassium today and continue Lokelma Continue with statin   Next appointment: 6 months and I will continue to follow in our device clinic   Medication Adjustments/Labs and Tests Ordered: Current medicines are reviewed at length with the patient today.  Concerns regarding medicines are outlined above.  Orders Placed This Encounter  Procedures   Comp Met (CMET)   Lipid Profile   CBC   No orders of the defined types were placed in this encounter.    History of Present Illness:    Kenneth Pham is a 67 y.o. male with a hx of chronic systolic heart failure with hypertensive heart disease most recent ejection fraction 30 to 35% stage II CKD hyperlipidemia ICD and hyperkalemia requiring resin binder therapy to maintain on Entresto last seen 01/26/2023.  Compliance with diet, lifestyle and medications: Yes  Overall he is pleased with the quality of his life he remains active but he finds if he walks for longer distance or uphill he is breathless not severe or limiting the pattern has not changed and is not having edema orthopnea. He continues taking potassium binder so he can tolerate his  Entresto with CKD he will need labs today including potassium renal function lipids and liver function test and a CBC. He follows in our device clinic with normal device function his last check had 1 short episode of nonsustained VT he did not require VT VF therapy Past Medical History:  Diagnosis Date   Acute CHF (HCC) 05/16/2019   Alcohol dependence in remission (HCC) 05/16/2019   Arteriosclerosis of carotid artery, right 05/15/2019   Bilateral carotid artery stenosis 05/15/2019   Cerebellar stroke, acute (HCC) 05/09/2019   Chronic anticoagulation 07/13/2019   CKD (chronic kidney disease) stage 2, GFR 60-89 ml/min 05/29/2019   Dilated cardiomyopathy (HCC) 05/29/2019   Heart failure with reduced ejection fraction (HCC) 05/09/2019   History of stroke involving cerebellum 05/09/2019   Hypercholesteremia    Hypertension    Hypertensive heart disease with heart failure (HCC) 05/29/2019   Nonsustained ventricular tachycardia (HCC) 06/19/2020   Formatting of this note might be different from the original. 2021: ICD   Precordial chest pain 08/21/2019   Prediabetes 06/08/2019   Formatting of this note might be different from the original. [facility] 05/29/2019: A1C 5.8%   Pulmonary nodules 05/15/2019   05/01/2019 CT angio - 8.59mm bandlike nodular density lingula - 5.75mm R middle lobe - 61.5 pack year hx, rec 3 month f/u   Shortness of breath 08/21/2019   Snores 05/29/2019   Stage 3a chronic kidney disease (HCC) 05/29/2019   Viral respiratory infection 10/16/2021   Formatting of this

## 2023-08-24 ENCOUNTER — Encounter: Payer: Self-pay | Admitting: Cardiology

## 2023-08-24 ENCOUNTER — Ambulatory Visit: Payer: 59 | Attending: Cardiology | Admitting: Cardiology

## 2023-08-24 VITALS — BP 146/88 | HR 74 | Ht 70.0 in | Wt 172.6 lb

## 2023-08-24 DIAGNOSIS — Z95810 Presence of automatic (implantable) cardiac defibrillator: Secondary | ICD-10-CM

## 2023-08-24 DIAGNOSIS — E7800 Pure hypercholesterolemia, unspecified: Secondary | ICD-10-CM

## 2023-08-24 DIAGNOSIS — I5022 Chronic systolic (congestive) heart failure: Secondary | ICD-10-CM

## 2023-08-24 DIAGNOSIS — E875 Hyperkalemia: Secondary | ICD-10-CM

## 2023-08-24 DIAGNOSIS — N182 Chronic kidney disease, stage 2 (mild): Secondary | ICD-10-CM | POA: Diagnosis not present

## 2023-08-24 DIAGNOSIS — I110 Hypertensive heart disease with heart failure: Secondary | ICD-10-CM

## 2023-08-24 NOTE — Patient Instructions (Signed)
Medication Instructions:  Your physician recommends that you continue on your current medications as directed. Please refer to the Current Medication list given to you today.  *If you need a refill on your cardiac medications before your next appointment, please call your pharmacy*   Lab Work: Your physician recommends that you return for lab work in:   Labs today: CMP, Lipid, CBC  If you have labs (blood work) drawn today and your tests are completely normal, you will receive your results only by: MyChart Message (if you have MyChart) OR A paper copy in the mail If you have any lab test that is abnormal or we need to change your treatment, we will call you to review the results.   Testing/Procedures: None   Follow-Up: At [facility], you and your health needs are our priority.  As part of our continuing mission to provide you with exceptional heart care, we have created designated Provider Care Teams.  These Care Teams include your primary Cardiologist (physician) and Advanced Practice Providers (APPs -  Physician Assistants and Nurse Practitioners) who all work together to provide you with the care you need, when you need it.  We recommend signing up for the patient portal called "MyChart".  Sign up information is provided on this After Visit Summary.  MyChart is used to connect with patients for Virtual Visits (Telemedicine).  Patients are able to view lab/test results, encounter notes, upcoming appointments, etc.  Non-urgent messages can be sent to your provider as well.   To learn more about what you can do with MyChart, go to [email or web address].    Your next appointment:   6 month(s)  Provider:   Norman Herrlich, MD    Other Instructions None

## 2023-08-25 ENCOUNTER — Ambulatory Visit (INDEPENDENT_AMBULATORY_CARE_PROVIDER_SITE_OTHER): Payer: 59

## 2023-08-25 DIAGNOSIS — I420 Dilated cardiomyopathy: Secondary | ICD-10-CM | POA: Diagnosis not present

## 2023-08-25 LAB — COMPREHENSIVE METABOLIC PANEL
ALT: 17 [IU]/L (ref 0–44)
AST: 19 [IU]/L (ref 0–40)
Albumin: 4.5 g/dL (ref 3.9–4.9)
Alkaline Phosphatase: 71 [IU]/L (ref 44–121)
BUN/Creatinine Ratio: 13 (ref 10–24)
BUN: 16 mg/dL (ref 8–27)
Bilirubin Total: 0.3 mg/dL (ref 0.0–1.2)
CO2: 23 mmol/L (ref 20–29)
Calcium: 9.7 mg/dL (ref 8.6–10.2)
Chloride: 104 mmol/L (ref 96–106)
Creatinine, Ser: 1.21 mg/dL (ref 0.76–1.27)
Globulin, Total: 2.5 g/dL (ref 1.5–4.5)
Glucose: 93 mg/dL (ref 70–99)
Potassium: 4.8 mmol/L (ref 3.5–5.2)
Sodium: 142 mmol/L (ref 134–144)
Total Protein: 7 g/dL (ref 6.0–8.5)
eGFR: 66 mL/min/{1.73_m2} (ref >59)

## 2023-08-25 LAB — CUP PACEART REMOTE DEVICE CHECK
Battery Remaining Longevity: 114 mo
Battery Voltage: 3.02 V
Brady Statistic RV Percent Paced: 0.05 %
Date Time Interrogation Session: 20241010022604
HighPow Impedance: 86 Ohm
Implantable Lead Connection Status: 753985
Implantable Lead Implant Date: 20210714
Implantable Lead Location: 753860
Implantable Pulse Generator Implant Date: 20210714
Lead Channel Impedance Value: 456 Ohm
Lead Channel Impedance Value: 570 Ohm
Lead Channel Pacing Threshold Amplitude: 0.625 V
Lead Channel Pacing Threshold Pulse Width: 0.4 ms
Lead Channel Sensing Intrinsic Amplitude: 10.75 mV
Lead Channel Sensing Intrinsic Amplitude: 10.75 mV
Lead Channel Setting Pacing Amplitude: 2 V
Lead Channel Setting Pacing Pulse Width: 0.4 ms
Lead Channel Setting Sensing Sensitivity: 0.3 mV
Zone Setting Status: 755011
Zone Setting Status: 755011

## 2023-08-25 LAB — CBC
Hematocrit: 40.6 % (ref 37.5–51.0)
Hemoglobin: 13.7 g/dL (ref 13.0–17.7)
MCH: 33.7 pg — ABNORMAL HIGH (ref 26.6–33.0)
MCHC: 33.7 g/dL (ref 31.5–35.7)
MCV: 100 fL — ABNORMAL HIGH (ref 79–97)
Platelets: 261 10*3/uL (ref 150–450)
RBC: 4.06 x10E6/uL — ABNORMAL LOW (ref 4.14–5.80)
RDW: 11.8 % (ref 11.6–15.4)
WBC: 7.9 10*3/uL (ref 3.4–10.8)

## 2023-08-25 LAB — LIPID PANEL
Chol/HDL Ratio: 3.7 {ratio} (ref 0.0–5.0)
Cholesterol, Total: 172 mg/dL (ref 100–199)
HDL: 46 mg/dL (ref >39)
LDL Chol Calc (NIH): 97 mg/dL (ref 0–99)
Triglycerides: 166 mg/dL — ABNORMAL HIGH (ref 0–149)
VLDL Cholesterol Cal: 29 mg/dL (ref 5–40)

## 2023-08-30 ENCOUNTER — Telehealth: Payer: Self-pay

## 2023-08-30 NOTE — Telephone Encounter (Signed)
Remote ICM transmission received.  Attempted call to patient regarding ICM remote transmission and left detailed message per DPR.  Left ICM phone number and advised to return call for any fluid symptoms or questions. Next ICM remote transmission scheduled 09/26/2023.

## 2023-08-30 NOTE — Progress Notes (Signed)
EPIC Encounter for ICM Monitoring  [name] is a 67 y.o. male Date: 08/30/2023 Primary Care Physican: Olive Bass, MD Primary Cardiologist: [facility]                                             Electrophysiologist: Elberta Fortis 01/18/2023 Weight: 173 lbs (baseline 173-175 lbs) 04/01/2023 Weight: 171 lbs 05/06/2023 Weight: 171-172 lbs 06/06/2023 Weight: 171 lbs 07/14/2023 Weight: 171 lbs                                                            Attempted call to patient and unable to reach.  Transmission reviewed.    Optivol thoracic impedance suggesting intermittent days with possible fluid accumulation within the last month.    Prescribed:  Furosemide 40 mg take 1 tablet(s) (40 mg total) by mouth as needed (take only when weight goes up 3 lbs from baseline).   Labs: 08/24/2023 Creatinine 1.21, BUN 16, Potassium 4.8, Sodium 142, GFR 66 01/26/2023 Creatinine 1.56, BUN 23, Potassium 4.9, Sodium 140, GFR 49 A complete set of results can be found in Results Review.   Recommendations:  Unable to reach.     Follow-up plan: ICM clinic phone appointment on 09/26/2023.   91 day device clinic remote transmission 11/24/2023.     EP/Cardiology Office Visits:  Recall 02/20/2024 with Dr. Dulce Sellar.  Recall 12/15/2023 with Dr Elberta Fortis.   Copy of ICM check sent to Dr. Elberta Fortis.     3 month ICM trend: 08/25/2023.    12-14 Month ICM trend:     Karie Soda, RN 08/30/2023 1:11 PM

## 2023-09-06 NOTE — Progress Notes (Signed)
Remote ICD transmission.   

## 2023-09-26 ENCOUNTER — Ambulatory Visit: Payer: 59 | Attending: Cardiology

## 2023-09-26 DIAGNOSIS — Z95810 Presence of automatic (implantable) cardiac defibrillator: Secondary | ICD-10-CM

## 2023-09-26 DIAGNOSIS — I5022 Chronic systolic (congestive) heart failure: Secondary | ICD-10-CM | POA: Diagnosis not present

## 2023-09-30 ENCOUNTER — Telehealth: Payer: Self-pay

## 2023-09-30 NOTE — Progress Notes (Signed)
EPIC Encounter for ICM Monitoring  [name] is a 67 y.o. male Date: 09/30/2023 Primary Care Physican: Olive Bass, MD Primary Cardiologist: [facility]                                             Electrophysiologist: Elberta Fortis 01/18/2023 Weight: 173 lbs (baseline 173-175 lbs) 04/01/2023 Weight: 171 lbs 05/06/2023 Weight: 171-172 lbs 06/06/2023 Weight: 171 lbs 07/14/2023 Weight: 171 lbs                                                            Attempted call to patient and unable to reach.  Transmission reviewed.    Optivol thoracic impedance suggesting normal fluid levels since 10/21.    Prescribed:  Furosemide 40 mg take 1 tablet(s) (40 mg total) by mouth as needed (take only when weight goes up 3 lbs from baseline).   Labs: 08/24/2023 Creatinine 1.21, BUN 16, Potassium 4.8, Sodium 142, GFR 66 01/26/2023 Creatinine 1.56, BUN 23, Potassium 4.9, Sodium 140, GFR 49 A complete set of results can be found in Results Review.   Recommendations:  Unable to reach.     Follow-up plan: ICM clinic phone appointment on 11/14/2023.   91 day device clinic remote transmission 11/24/2023.     EP/Cardiology Office Visits:  Recall 02/20/2024 with Dr. Dulce Sellar.  Recall 12/15/2023 with Dr Elberta Fortis.   Copy of ICM check sent to Dr. Elberta Fortis.    3 month ICM trend: 09/26/2023.    12-14 Month ICM trend:     Karie Soda, RN 09/30/2023 1:55 PM

## 2023-09-30 NOTE — Telephone Encounter (Signed)
Remote ICM transmission received.  Attempted call to patient regarding ICM remote transmission and no answer.  

## 2023-11-14 ENCOUNTER — Ambulatory Visit: Payer: 59 | Attending: Cardiology

## 2023-11-14 DIAGNOSIS — I5022 Chronic systolic (congestive) heart failure: Secondary | ICD-10-CM

## 2023-11-14 DIAGNOSIS — Z95810 Presence of automatic (implantable) cardiac defibrillator: Secondary | ICD-10-CM | POA: Diagnosis not present

## 2023-11-17 ENCOUNTER — Telehealth: Payer: Self-pay

## 2023-11-17 NOTE — Telephone Encounter (Signed)
 Remote ICM transmission received.  Attempted call to patient regarding ICM remote transmission and person answering phone stated he was sleeping.

## 2023-11-17 NOTE — Telephone Encounter (Signed)
 Opened in error

## 2023-11-24 ENCOUNTER — Ambulatory Visit (INDEPENDENT_AMBULATORY_CARE_PROVIDER_SITE_OTHER): Payer: 59

## 2023-11-24 DIAGNOSIS — I5022 Chronic systolic (congestive) heart failure: Secondary | ICD-10-CM | POA: Diagnosis not present

## 2023-11-24 LAB — CUP PACEART REMOTE DEVICE CHECK
Battery Remaining Longevity: 111 mo
Battery Voltage: 3.02 V
Brady Statistic RV Percent Paced: 0.01 %
Date Time Interrogation Session: 20250109043622
HighPow Impedance: 72 Ohm
Implantable Lead Connection Status: 753985
Implantable Lead Implant Date: 20210714
Implantable Lead Location: 753860
Implantable Pulse Generator Implant Date: 20210714
Lead Channel Impedance Value: 513 Ohm
Lead Channel Impedance Value: 532 Ohm
Lead Channel Pacing Threshold Amplitude: 0.625 V
Lead Channel Pacing Threshold Pulse Width: 0.4 ms
Lead Channel Sensing Intrinsic Amplitude: 11.875 mV
Lead Channel Sensing Intrinsic Amplitude: 11.875 mV
Lead Channel Setting Pacing Amplitude: 2 V
Lead Channel Setting Pacing Pulse Width: 0.4 ms
Lead Channel Setting Sensing Sensitivity: 0.3 mV
Zone Setting Status: 755011
Zone Setting Status: 755011

## 2023-12-19 ENCOUNTER — Ambulatory Visit: Payer: 59 | Attending: Cardiology

## 2023-12-19 DIAGNOSIS — I5022 Chronic systolic (congestive) heart failure: Secondary | ICD-10-CM | POA: Diagnosis not present

## 2023-12-19 DIAGNOSIS — Z95810 Presence of automatic (implantable) cardiac defibrillator: Secondary | ICD-10-CM

## 2024-01-03 NOTE — Progress Notes (Signed)
 Remote ICD transmission.

## 2024-01-23 ENCOUNTER — Other Ambulatory Visit: Payer: Self-pay | Admitting: Cardiology

## 2024-01-23 ENCOUNTER — Ambulatory Visit: Payer: 59 | Attending: Cardiology

## 2024-01-23 DIAGNOSIS — I5022 Chronic systolic (congestive) heart failure: Secondary | ICD-10-CM | POA: Diagnosis not present

## 2024-01-23 DIAGNOSIS — Z95810 Presence of automatic (implantable) cardiac defibrillator: Secondary | ICD-10-CM

## 2024-01-24 ENCOUNTER — Other Ambulatory Visit: Payer: Self-pay | Admitting: Cardiology

## 2024-01-26 NOTE — Progress Notes (Signed)
 EPIC Encounter for ICM Monitoring  [name] is a 68 y.o. male Date: 01/26/2024 Primary Care Physican: Olive Bass, MD Primary Cardiologist: [facility]                                             Electrophysiologist: Elberta Fortis 01/18/2023 Weight: 173 lbs (baseline 173-175 lbs) 04/01/2023 Weight: 171 lbs 05/06/2023 Weight: 171-172 lbs 06/06/2023 Weight: 171 lbs 07/14/2023 Weight: 171 lbs 01/26/2024 Weight: 169-170 lbs                                                           Spoke with patient and heart failure questions reviewed.  Transmission results reviewed.  Pt asymptomatic for fluid accumulation.  Reports feeling well at this time and voices no complaints.     Optivol thoracic impedance suggesting normal fluid levels since 2/10.   Prescribed:  Furosemide 40 mg take 1 tablet(s) (40 mg total) by mouth as needed (take only when weight goes up 3 lbs from baseline).   Labs: 08/24/2023 Creatinine 1.21, BUN 16, Potassium 4.8, Sodium 142, GFR 66 01/26/2023 Creatinine 1.56, BUN 23, Potassium 4.9, Sodium 140, GFR 49 A complete set of results can be found in Results Review.   Recommendations:  No changes and encouraged to call if experiencing any fluid symptoms.   Follow-up plan: ICM clinic phone appointment on 02/27/2024.   91 day device clinic remote transmission 02/23/2024.     EP/Cardiology Office Visits: 02/28/2024 with Dr. Dulce Sellar.  Advised to call Dr Elberta Fortis office for appointment and provided EP scheduler number. Recall 12/15/2023 with Dr Elberta Fortis.   Copy of ICM check sent to Dr. Elberta Fortis.     3 month ICM trend: 01/23/2024.    12-14 Month ICM trend:     Karie Soda, RN 01/26/2024 7:37 AM

## 2024-01-30 ENCOUNTER — Other Ambulatory Visit: Payer: Self-pay | Admitting: Cardiology

## 2024-02-16 ENCOUNTER — Other Ambulatory Visit: Payer: Self-pay | Admitting: Cardiology

## 2024-02-16 ENCOUNTER — Telehealth: Payer: Self-pay | Admitting: Cardiology

## 2024-02-16 MED ORDER — ALBUTEROL SULFATE HFA 108 (90 BASE) MCG/ACT IN AERS: 2.0000 | INHALATION_SPRAY | Freq: Four times a day (QID) | RESPIRATORY_TRACT | 2 refills | Status: AC | PRN

## 2024-02-16 MED ORDER — DAPAGLIFLOZIN PROPANEDIOL 10 MG PO TABS: 10.0000 mg | ORAL_TABLET | Freq: Every day | ORAL | 2 refills | Status: DC

## 2024-02-16 NOTE — Telephone Encounter (Signed)
 Patient needs refill on meds listed below: Farxiga 10 MG Albuterol inhaler  Please send refills to [facility] on [address]. Patient is scheduled to see Dr. Dulce Sellar upon his return on 05/08/24.

## 2024-02-16 NOTE — Telephone Encounter (Signed)
 Farxiga and inhaler sent to [facility] on [address]

## 2024-02-23 ENCOUNTER — Ambulatory Visit (INDEPENDENT_AMBULATORY_CARE_PROVIDER_SITE_OTHER): Payer: Self-pay

## 2024-02-23 DIAGNOSIS — I420 Dilated cardiomyopathy: Secondary | ICD-10-CM | POA: Diagnosis not present

## 2024-02-23 LAB — CUP PACEART REMOTE DEVICE CHECK
Battery Remaining Longevity: 108 mo
Battery Voltage: 3.03 V
Brady Statistic RV Percent Paced: 0.01 %
Date Time Interrogation Session: 20250410001603
HighPow Impedance: 79 Ohm
Implantable Lead Connection Status: 753985
Implantable Lead Implant Date: 20210714
Implantable Lead Location: 753860
Implantable Pulse Generator Implant Date: 20210714
Lead Channel Impedance Value: 532 Ohm
Lead Channel Impedance Value: 570 Ohm
Lead Channel Pacing Threshold Amplitude: 0.5 V
Lead Channel Pacing Threshold Pulse Width: 0.4 ms
Lead Channel Sensing Intrinsic Amplitude: 11.125 mV
Lead Channel Sensing Intrinsic Amplitude: 11.125 mV
Lead Channel Setting Pacing Amplitude: 2 V
Lead Channel Setting Pacing Pulse Width: 0.4 ms
Lead Channel Setting Sensing Sensitivity: 0.3 mV
Zone Setting Status: 755011
Zone Setting Status: 755011

## 2024-02-27 ENCOUNTER — Ambulatory Visit: Attending: Cardiology

## 2024-02-27 DIAGNOSIS — Z95810 Presence of automatic (implantable) cardiac defibrillator: Secondary | ICD-10-CM | POA: Diagnosis not present

## 2024-02-27 DIAGNOSIS — I5022 Chronic systolic (congestive) heart failure: Secondary | ICD-10-CM

## 2024-02-28 ENCOUNTER — Telehealth: Payer: Self-pay

## 2024-02-28 ENCOUNTER — Ambulatory Visit: Payer: 59 | Admitting: Cardiology

## 2024-02-28 NOTE — Progress Notes (Signed)
 EPIC Encounter for ICM Monitoring  [name] is a 68 y.o. male Date: 02/28/2024 Primary Care Physican: Madlyn Schirmer, MD Primary Cardiologist: [facility]                                             Electrophysiologist: Lawana Pray 01/18/2023 Weight: 173 lbs (baseline 173-175 lbs) 04/01/2023 Weight: 171 lbs 05/06/2023 Weight: 171-172 lbs 06/06/2023 Weight: 171 lbs 07/14/2023 Weight: 171 lbs 01/26/2024 Weight: 169-170 lbs                                                           Attempted call to patient and unable to reach.  Left message for return call.  Transmission results reviewed.      Optivol thoracic impedance suggesting possible fluid accumulation starting 4/6.   Prescribed:  Furosemide 40 mg take 1 tablet(s) (40 mg total) by mouth as needed (take only when weight goes up 3 lbs from baseline).   Labs: 08/24/2023 Creatinine 1.21, BUN 16, Potassium 4.8, Sodium 142, GFR 66 01/26/2023 Creatinine 1.56, BUN 23, Potassium 4.9, Sodium 140, GFR 49 A complete set of results can be found in Results Review.   Recommendations:  Unable to reach.     Follow-up plan: ICM clinic phone appointment on 03/05/2024 to recheck fluid levels.   91 day device clinic remote transmission 05/24/2024.     EP/Cardiology Office Visits: 05/08/2024 with Dr. Sandee Crook.  Pt is aware to call Dr Lawana Pray office for appointment and has EP scheduler number.    Recall 12/15/2023 with Dr Lawana Pray.   Copy of ICM check sent to Dr. Lawana Pray.     3 month ICM trend: 02/27/2024.    12-14 Month ICM trend:     Almyra Jain, RN 02/28/2024 8:55 AM

## 2024-02-28 NOTE — Telephone Encounter (Signed)
Remote ICM transmission received.  Attempted call to patient regarding ICM remote transmission and left message to return call   

## 2024-03-05 ENCOUNTER — Ambulatory Visit: Attending: Cardiology

## 2024-03-05 DIAGNOSIS — I5022 Chronic systolic (congestive) heart failure: Secondary | ICD-10-CM

## 2024-03-05 DIAGNOSIS — Z95810 Presence of automatic (implantable) cardiac defibrillator: Secondary | ICD-10-CM

## 2024-03-06 NOTE — Progress Notes (Signed)
 EPIC Encounter for ICM Monitoring  [name] is a 68 y.o. male Date: 03/06/2024 Primary Care Physican: Madlyn Schirmer, MD Primary Cardiologist: [facility]                                             Electrophysiologist: Lawana Pray 01/18/2023 Weight: 173 lbs (baseline 173-175 lbs) 04/01/2023 Weight: 171 lbs 05/06/2023 Weight: 171-172 lbs 06/06/2023 Weight: 171 lbs 07/14/2023 Weight: 171 lbs 01/26/2024 Weight: 169-170 lbs                                                           Transmission results reviewed.      Optivol thoracic impedance suggesting fluid levels returned to normal 4/14.   Prescribed:  Furosemide  40 mg take 1 tablet(s) (40 mg total) by mouth as needed (take only when weight goes up 3 lbs from baseline).   Labs: 08/24/2023 Creatinine 1.21, BUN 16, Potassium 4.8, Sodium 142, GFR 66 01/26/2023 Creatinine 1.56, BUN 23, Potassium 4.9, Sodium 140, GFR 49 A complete set of results can be found in Results Review.   Recommendations:  No changes.    Follow-up plan: ICM clinic phone appointment on 04/02/2024.   91 day device clinic remote transmission 05/24/2024.     EP/Cardiology Office Visits: 05/08/2024 with Dr. Sandee Crook.  Pt is aware to call Dr Lawana Pray office for appointment and has EP scheduler number.    Recall 12/15/2023 with Dr Lawana Pray.   Copy of ICM check sent to Dr. Lawana Pray.      3 month ICM trend: 03/05/2024.    12-14 Month ICM trend:     Almyra Jain, RN 03/06/2024 3:35 PM

## 2024-03-08 ENCOUNTER — Other Ambulatory Visit: Payer: Self-pay | Admitting: Cardiology

## 2024-04-02 ENCOUNTER — Ambulatory Visit: Attending: Cardiology

## 2024-04-02 DIAGNOSIS — Z95810 Presence of automatic (implantable) cardiac defibrillator: Secondary | ICD-10-CM

## 2024-04-02 DIAGNOSIS — I5022 Chronic systolic (congestive) heart failure: Secondary | ICD-10-CM | POA: Diagnosis not present

## 2024-04-02 NOTE — Progress Notes (Signed)
 EPIC Encounter for ICM Monitoring  [name] is a 68 y.o. male Date: 04/02/2024 Primary Care Physican: Madlyn Schirmer, MD Primary Cardiologist: [facility]                                             Electrophysiologist: Lawana Pray 01/18/2023 Weight: 173 lbs (baseline 173-175 lbs) 04/01/2023 Weight: 171 lbs 05/06/2023 Weight: 171-172 lbs 06/06/2023 Weight: 171 lbs 07/14/2023 Weight: 171 lbs 01/26/2024 Weight: 169-170 lbs      04/02/2024 Weight: 170 lbs                                                      Spoke with patient and heart failure questions reviewed.  Transmission results reviewed.  Pt reports feeling SOB, ankle swelling and was dx with pneumonia 4/17.  Pneumonia has resolved but he continues to SOB especially on exertion and ankle swelling.  He reports diet has not changed.   Optivol thoracic impedance suggesting possible fluid accumulation starting 4/19 .  Fluid index greater than normal threshold starting 4/30.   Prescribed:  Furosemide  40 mg take 1 tablet(s) (40 mg total) by mouth as needed (take only when weight goes up 3 lbs from baseline).   Labs: 08/24/2023 Creatinine 1.21, BUN 16, Potassium 4.8, Sodium 142, GFR 66 01/26/2023 Creatinine 1.56, BUN 23, Potassium 4.9, Sodium 140, GFR 49 A complete set of results can be found in Results Review.   Recommendations:  He has been taking PRN Lasix  for past 3 days and today.  Advised to continue daily PRN Lasix  1 tablet at least for next 4 days.  Advised to call Dr [facility] office or use ER if sx do not resolve or get worse and he agreed.    Follow-up plan: ICM clinic phone appointment on 04/10/2024 to recheck fluid levels.   91 day device clinic remote transmission 05/24/2024.     EP/Cardiology Office Visits: 05/08/2024 with Dr. Sandee Crook.  Pt is aware to call Dr Lawana Pray office for appointment and has EP scheduler number.    Recall 12/15/2023 with Dr Lawana Pray.   Copy of ICM check sent to Dr. Lawana Pray and Dr Sandee Crook for review.    3  month ICM trend: 04/02/2024.    12-14 Month ICM trend:     Almyra Jain, RN 04/02/2024 9:13 AM

## 2024-04-02 NOTE — Progress Notes (Signed)
 Remote ICD transmission.

## 2024-04-02 NOTE — Progress Notes (Signed)
  Received: Today Camnitz, Babetta Lesch, MD  Glenora Morocho, Myrtie Atkinson, RN [facility] is out of the moment.  He can take his Lasix  daily for the next 4 days.  Thank you.

## 2024-04-10 ENCOUNTER — Ambulatory Visit: Attending: Cardiology

## 2024-04-10 DIAGNOSIS — I5022 Chronic systolic (congestive) heart failure: Secondary | ICD-10-CM

## 2024-04-10 DIAGNOSIS — Z95810 Presence of automatic (implantable) cardiac defibrillator: Secondary | ICD-10-CM

## 2024-04-11 NOTE — Progress Notes (Unsigned)
 EPIC Encounter for ICM Monitoring  [name] is a 68 y.o. male Date: 04/11/2024 Primary Care Physican: Madlyn Schirmer, MD Primary Cardiologist: [facility]                                             Electrophysiologist: Lawana Pray 01/18/2023 Weight: 173 lbs (baseline 173-175 lbs) 04/01/2023 Weight: 171 lbs 05/06/2023 Weight: 171-172 lbs 06/06/2023 Weight: 171 lbs 07/14/2023 Weight: 171 lbs 01/26/2024 Weight: 169-170 lbs      04/02/2024 Weight: 170 lbs  04/11/2024 Weight: Home scale is broke                                                     Spoke with patient and heart failure questions reviewed.  Transmission results reviewed.  Pt reports ankle swelling has resolved and SOB has improved.      Optivol thoracic impedance suggesting possible fluid accumulation starting 4/19 but improving since taking Lasix  40 mg daily since 5/18 with the exception of 2 days.    Optivol was stable for the past year until April 2025.     Pt called office this morning, 5/28 to report his device is making a siren noise every 4 hours since 5/24.  Discussed with Henrene Locust, device RN and patient will be seen in the office on 6/2.     Prescribed:  Furosemide  40 mg take 1 tablet(s) (40 mg total) by mouth as needed (take only when weight goes up 3 lbs from baseline).   Labs: 08/24/2023 Creatinine 1.21, BUN 16, Potassium 4.8, Sodium 142, GFR 66 01/26/2023 Creatinine 1.56, BUN 23, Potassium 4.9, Sodium 140, GFR 49 A complete set of results can be found in Results Review.   Recommendations:  He has been taking PRN Lasix  since 5/18 except for 2 days.   Copy sent for review regarding abnormal Optivol and if patient should continue PRN lasix  daily.     Follow-up plan: ICM clinic phone appointment on 04/23/2024 to recheck fluid levels (has OV on 6/2).   91 day device clinic remote transmission 05/24/2024.     EP/Cardiology Office Visits: 05/08/2024 with Dr. Sandee Crook.  04/16/2024 with Dr Lawana Pray.   Copy of ICM check  sent to Dr. Lawana Pray and Dr Sandee Crook for review and recommendations if needed.       3 month ICM trend: 04/11/2024.    12-14 Month ICM trend:     Almyra Jain, RN 04/11/2024 10:08 AM

## 2024-04-13 NOTE — Progress Notes (Signed)
 Spoke with patient and advised did not receive any recommendations regarding use of Lasix  daily from Dr Sandee Crook.  He has taken Lasix  every days this week but 1 and feels okay.  He has an appt with Dr Lawana Pray on 6/2 and will discuss how often he needs to take Lasix  to help prevent fluid build up.

## 2024-04-15 NOTE — Progress Notes (Unsigned)
  Electrophysiology Office Note:   Date:  04/16/2024  ID:  Kenneth Pham, DOB 05-13-1956, MRN [id number]  Primary Cardiologist: Kenneth Hinds, MD Primary Heart Failure: None Electrophysiologist: Kenneth Abdulla Kenneth Ding, MD      History of Present Illness:   Kenneth Pham is a 68 y.o. male with h/o chronic systolic heart failure due to nonischemic cardiomyopathy, hypertension, hyperlipidemia, CVA, alcohol abuse in remission, CKD stage III seen today for routine electrophysiology followup.   Since last being seen in our clinic the patient reports doing overall well.  He has noted some increased shortness of breath with exertion over the last few weeks.  He has been taking his Lasix  on a daily basis.  Aside from that, he has no acute complaints.  He has been sleeping on 1 pillow.  He does not have any chest pain.  he denies chest pain, palpitations, PND, orthopnea, nausea, vomiting, dizziness, syncope, edema, weight gain, or early satiety.   Review of systems complete and found to be negative unless listed in HPI.      EP Information / Studies Reviewed:    EKG is ordered today. Personal review as below.  EKG Interpretation Date/Time:  Monday April 16 2024 08:23:39 EDT Ventricular Rate:  69 PR Interval:  178 QRS Duration:  94 QT Interval:  398 QTC Calculation: 426 R Axis:   77  Text Interpretation: Normal sinus rhythm ST & T wave abnormality, consider lateral ischemia When compared with ECG of 28-May-2020 15:17, No significant change since last tracing Confirmed by Kenneth Pham ([id number]) on 04/16/2024 8:35:32 AM   ICD Interrogation-  reviewed in detail today,  See PACEART report.  Device History: Medtronic Single Chamber ICD implanted 05/28/2020 for chronic systolic heart failure History of appropriate therapy: No History of AAD therapy: No   Risk Assessment/Calculations:             Physical Exam:   VS:  BP 130/82   Pulse 69   Ht 5\' 10"  (1.778 m)   Wt 173 lb (78.5 kg)   SpO2 96%    BMI 24.82 kg/m    Wt Readings from Last 3 Encounters:  04/16/24 173 lb (78.5 kg)  08/24/23 172 lb 9.6 oz (78.3 kg)  01/26/23 171 lb (77.6 kg)     GEN: Well nourished, well developed in no acute distress NECK: No JVD; No carotid bruits CARDIAC: Regular rate and rhythm, no murmurs, rubs, gallops RESPIRATORY:  Clear to auscultation without rales, wheezing or rhonchi  ABDOMEN: Soft, non-tender, non-distended EXTREMITIES:  No edema; No deformity   ASSESSMENT AND PLAN:    Chronic systolic dysfunction s/p Medtronic single chamber ICD  euvolemic today Stable on an appropriate medical regimen Normal ICD function See Pace Art report Sensing, threshold, impedance within normal limits Noise noted on ICD lead with change in sensing vector Volume overloaded on OptiVol with shortness of breath.  Kenneth Pham increase Lasix  to twice daily dosing for the next 3 days and check BMP.  2.  Hypertension: Well-controlled  3.  Hyperlipidemia: Continue statin per primary cardiology  4.  Nonsustained VT: Noted on device interrogation.  Minimal episodes.  Disposition:   Follow up with Dr. Lawana Pham in 12 months   Signed, Kenneth Mazariego Kenneth Ding, MD

## 2024-04-16 ENCOUNTER — Ambulatory Visit: Attending: Cardiology | Admitting: Cardiology

## 2024-04-16 ENCOUNTER — Encounter: Payer: Self-pay | Admitting: Cardiology

## 2024-04-16 VITALS — BP 130/82 | HR 69 | Ht 70.0 in | Wt 173.0 lb

## 2024-04-16 DIAGNOSIS — I5022 Chronic systolic (congestive) heart failure: Secondary | ICD-10-CM

## 2024-04-16 DIAGNOSIS — I472 Ventricular tachycardia, unspecified: Secondary | ICD-10-CM

## 2024-04-16 DIAGNOSIS — I10 Essential (primary) hypertension: Secondary | ICD-10-CM

## 2024-04-16 DIAGNOSIS — R0602 Shortness of breath: Secondary | ICD-10-CM

## 2024-04-16 NOTE — Patient Instructions (Signed)
 Medication Instructions:  Your physician has recommended you make the following change in your medication:  TAKE Lasix  40 mg TWICE a day for the next 3 days, then return to normal dosing      (Increase your potassium intake over these 3 days)  *If you need a refill on your cardiac medications before your next appointment, please call your pharmacy*  Lab Work: Today: BMET  If you have any lab test that is abnormal or we need to change your treatment, we will call you to review the results.  Testing/Procedures: None ordered  Follow-Up: At [facility], you and your health needs are our priority.  As part of our continuing mission to provide you with exceptional heart care, our providers are all part of one team.  This team includes your primary Cardiologist (physician) and Advanced Practice Providers or APPs (Physician Assistants and Nurse Practitioners) who all work together to provide you with the care you need, when you need it.  Your next appointment:   1 month(s)  Provider:   Pattricia Bores, NP Georgeana Kindler)    Your physician recommends that you schedule a follow-up appointment in: 1 year with Dr. Lawana Pray    We recommend signing up for the patient portal called "MyChart".  Sign up information is provided on this After Visit Summary.  MyChart is used to connect with patients for Virtual Visits (Telemedicine).  Patients are able to view lab/test results, encounter notes, upcoming appointments, etc.  Non-urgent messages can be sent to your provider as well.   To learn more about what you can do with MyChart, go to [email or web address].   Thank you for choosing [facility]!!   Reece Cane, RN [phone]

## 2024-04-17 LAB — BASIC METABOLIC PANEL WITH GFR
BUN/Creatinine Ratio: 12 (ref 10–24)
BUN: 16 mg/dL (ref 8–27)
CO2: 23 mmol/L (ref 20–29)
Calcium: 9.4 mg/dL (ref 8.6–10.2)
Chloride: 101 mmol/L (ref 96–106)
Creatinine, Ser: 1.34 mg/dL — ABNORMAL HIGH (ref 0.76–1.27)
Glucose: 101 mg/dL — ABNORMAL HIGH (ref 70–99)
Potassium: 4.7 mmol/L (ref 3.5–5.2)
Sodium: 143 mmol/L (ref 134–144)
eGFR: 58 mL/min/{1.73_m2} — ABNORMAL LOW (ref >59)

## 2024-04-23 ENCOUNTER — Ambulatory Visit: Attending: Cardiology

## 2024-04-23 DIAGNOSIS — Z95810 Presence of automatic (implantable) cardiac defibrillator: Secondary | ICD-10-CM

## 2024-04-23 DIAGNOSIS — I5022 Chronic systolic (congestive) heart failure: Secondary | ICD-10-CM

## 2024-04-24 ENCOUNTER — Telehealth: Payer: Self-pay

## 2024-04-24 NOTE — Telephone Encounter (Signed)
 Returned patient call as requested by voice mail message.  Pt stated the alarm was turned off by Howell Macintosh, device clinic nurse in the office on 6/2 but the alarm started again on 6/8.  It alarms every 4 hours except for late at night. He feels fine and has no complaints.    Advised will send information to device clinic for follow up.

## 2024-04-24 NOTE — Progress Notes (Signed)
 EPIC Encounter for ICM Monitoring  [name] is a 68 y.o. male Date: 04/24/2024 Primary Care Physican: Madlyn Schirmer, MD Primary Cardiologist: [facility]                                             Electrophysiologist: Lawana Pray 01/18/2023 Weight: 173 lbs (baseline 173-175 lbs) 04/01/2023 Weight: 171 lbs 05/06/2023 Weight: 171-172 lbs 06/06/2023 Weight: 171 lbs 07/14/2023 Weight: 171 lbs 01/26/2024 Weight: 169-170 lbs      04/02/2024 Weight: 170 lbs  04/11/2024 Weight: Home scale is broke                                                     Spoke with patient and heart failure questions reviewed.  Transmission results reviewed.  Pt is asymptomatic for fluid accumulation but his device is alarming again.  Device nurse adjusted device at 6/2 but the alarm restarted again on 6/8.  It alarms every 4 hours except at night.     Optivol thoracic impedance suggesting fluid levels returned to normal after Dr Lawana Pray recommended to take Lasix  40 mg twice a day x 3 days.   Phone note sent to device clinic triage regarding alarm.  Report shows Alert for: RV Lead Integrity warning on 19-Apr-2024 (2 or more criteria met). Review High Rate-NS episodes, Sensing Integrity Counter (Yosselyn Tax V-V Intervals) and RV Lead Impedance. The oldest High Rate-NS episode associated with this observation is dated 16-Apr-2024. [ High Rate-NS , Sensing Integrity Counter , RV Lead Impedance ]   Prescribed:  Furosemide  40 mg take 1 tablet(s) (40 mg total) by mouth as needed (take only when weight goes up 3 lbs from baseline).   Labs: 04/16/2024 Creatinine 1.34, BUN 16, Potassium 4.7, Sodium 143, GFR 58 A complete set of results can be found in Results Review.   Recommendations:  Pt completed Lasix  40 mg bid x 3 days and now taking 1 tablet daily.  Advised to limit salt and fluid intake and call if experiencing any fluid symptoms.     Follow-up plan: ICM clinic phone appointment on 06/04/2024.   91 day device clinic remote  transmission 05/24/2024.     EP/Cardiology Office Visits: 05/08/2024 with Dr. Ronell Coe.  05/16/2024 with Pattricia Bores, NP.  Recall 04/11/2025 with Dr Lawana Pray.   Copy of ICM check sent to Dr. Lawana Pray.   3 month ICM trend: 04/23/2024.    12-14 Month ICM trend:     Almyra Jain, RN 04/24/2024 9:55 AM

## 2024-04-24 NOTE — Telephone Encounter (Signed)
 Called and spoke with patient. Lead integrity alert. SIC: 63 since 04/16/24. Reviewed w/ WC. Likely needs to be extracted or talk about extracting with one of the EP doctors that do extractions. Pt refuses to come to [facility] for disabling alert. Appointment made 6/16 in [place] on [facility] schedule for RN/industry to see. Sending to EP scheduling to set up appointment with GT/JP/CL

## 2024-04-24 NOTE — Telephone Encounter (Signed)
 Spoke w/ MDT representative. Changes that should be made are as follows - disable audible alerts to patient DO NOT DISABLE ALERTS TO REMOTE MONITORING - Change RV Lead Noise to ON from On+Timeout After making the above change, ensure that High rate timeout is disabled (currently set to VF Zone Only: 0.75 min)   I told patient how important this is. I tried to get patient in tomorrow to device clinic and Friday with Harvie Liner MD to discuss options concerning lead extraction. Pt states he doesn't have a ride and he can't make it. I explained why it was important relative to the potential for unnecessary shocks and offered to schedule him for the appointment on Friday and give him the schedulers number if he needs to reschedule it and he agreed to that.

## 2024-04-26 NOTE — Progress Notes (Signed)
  Electrophysiology Office Follow up Visit Note:    Date:  04/27/2024   ID:  Kenneth Pham, DOB Feb 28, 1956, MRN [id number]  PCP:  Kenneth Schirmer, MD  [facility] Cardiologist:  Kenneth Hinds, MD  [facility] Electrophysiologist:  Kenneth Cortland Ding, MD    Interval History:     Kenneth Pham is a 68 y.o. male who presents for a follow up visit.   [name] presents to discuss RV lead integrity alert.  He has a SIC counter of 67. The patient last saw Kenneth Pham April 16, 2024.  He has a history of chronic systolic heart failure secondary to nonischemic cardiomyopathy, hypertension, hyperlipidemia, stroke, alcohol abuse in remission and CKD 3. The patient's ICD was implanted May 28, 2020.        Past medical, surgical, social and family history were reviewed.  ROS:   Please see the history of present illness.    All other systems reviewed and are negative.  EKGs/Labs/Other Studies Reviewed:    The following studies were reviewed today:  April 27, 2024 in-clinic device interrogation personally reviewed SIC counter 163 over the past 2 weeks. Lead parameter stable.  No noise sensed by the device with isometrics today. Lead noise episodes reviewed.  Nonphysiologic noise detected by the device as HVR and appropriately          Physical Exam:    VS:  BP (!) 162/88 (BP Location: Left Arm, Patient Position: Sitting, Cuff Size: Normal)   Pulse 64   Ht 5' 10 (1.778 m)   Wt 171 lb (77.6 kg)   SpO2 96%   BMI 24.54 kg/m     Wt Readings from Last 3 Encounters:  04/27/24 171 lb (77.6 kg)  04/16/24 173 lb (78.5 kg)  08/24/23 172 lb 9.6 oz (78.3 kg)     GEN: no distress CARD: RRR, No MRG.  CIED pocket well-healed RESP: No IWOB. CTAB.      ASSESSMENT:    1. Chronic systolic (congestive) heart failure (HCC)   2. ICD (implantable cardioverter-defibrillator) in place   3. Malfunction of implantable defibrillator ventricular (ICD) lead    PLAN:    In order of  problems listed above:  #Chronic sick Kenneth Pham heart failure #Nonischemic cardiomyopathy #ICD in situ #RV lead malfunction The patient has an ICD with evidence of RV lead fracture.  There is noise on the lead and the SIC counter is elevated. I have discussed the implications of such as a lead abnormality.  We discussed the potential that his device would not be able to deliver a successful shock in the event of an arrhythmia.  I discussed lead abandonment/addition of a new lead and lead extraction and replacement.  Given his young age, favor lead extraction and replacement.  I discussed the procedure in detail including the risks, recovery and likelihood of success.  He would like to proceed with scheduling.  Risks, benefits, alternatives to ICD lead extraction and replacement were discussed in detail with the patient today. The patient understands that the risks include but are not limited to bleeding, infection, pneumothorax, perforation, tamponade, severe bleeding requiring emergent open heart surgery, vascular damage, renal failure, MI, stroke, death, and lead dislodgement and wishes to proceed.  We Kenneth therefore schedule device implantation at the next available time.    Signed, Kenneth Liner, MD, [facility], [facility] 04/27/2024 11:32 AM    Electrophysiology [facility]

## 2024-04-27 ENCOUNTER — Ambulatory Visit: Attending: Cardiology | Admitting: Cardiology

## 2024-04-27 ENCOUNTER — Encounter: Payer: Self-pay | Admitting: Cardiology

## 2024-04-27 VITALS — BP 162/88 | HR 64 | Ht 70.0 in | Wt 171.0 lb

## 2024-04-27 DIAGNOSIS — Z95810 Presence of automatic (implantable) cardiac defibrillator: Secondary | ICD-10-CM

## 2024-04-27 DIAGNOSIS — T82198A Other mechanical complication of other cardiac electronic device, initial encounter: Secondary | ICD-10-CM

## 2024-04-27 DIAGNOSIS — I5022 Chronic systolic (congestive) heart failure: Secondary | ICD-10-CM | POA: Diagnosis not present

## 2024-04-27 NOTE — Patient Instructions (Addendum)
 Medication Instructions:  Your physician recommends that you continue on your current medications as directed. Please refer to the Current Medication list given to you today.  *If you need a refill on your cardiac medications before your next appointment, please call your pharmacy*  Testing/Procedures: Cardiac CT Scan Your physician has requested that you have cardiac CT. Cardiac computed tomography (CT) is a painless test that uses an x-ray machine to take clear, detailed pictures of your heart. For further information please visit [email or web address] Please follow instruction sheet as given.  ICD Lead Extraction - we will contact you to schedule   Follow-Up: At [facility], you and your health needs are our priority.  As part of our continuing mission to provide you with exceptional heart care, our providers are all part of one team.  This team includes your primary Cardiologist (physician) and Advanced Practice Providers or APPs (Physician Assistants and Nurse Practitioners) who all work together to provide you with the care you need, when you need it.  Your next appointment:   We will call you to arrange your post-procedure follow up appointments.

## 2024-04-30 ENCOUNTER — Encounter: Admitting: Cardiology

## 2024-05-01 ENCOUNTER — Telehealth: Payer: Self-pay

## 2024-05-01 ENCOUNTER — Other Ambulatory Visit: Payer: Self-pay

## 2024-05-01 DIAGNOSIS — Z95810 Presence of automatic (implantable) cardiac defibrillator: Secondary | ICD-10-CM

## 2024-05-01 DIAGNOSIS — I5022 Chronic systolic (congestive) heart failure: Secondary | ICD-10-CM

## 2024-05-01 DIAGNOSIS — T82198A Other mechanical complication of other cardiac electronic device, initial encounter: Secondary | ICD-10-CM

## 2024-05-01 NOTE — Telephone Encounter (Signed)
 Alert received from [employer]:  Alert remote transmission:  Ventricular defibrillation impedance out of range Programmed alert >130 ohms, currently 135 ohms - route to triage  Pt with known RV lead failure.  Will be scheduled for extraction.

## 2024-05-01 NOTE — Telephone Encounter (Signed)
 Pt is alarming. Kenneth Pham is going to call the pt after speaking with Dr. Marven Slimmer.

## 2024-05-02 NOTE — Telephone Encounter (Signed)
 Pt device alarming d/t new ventricular defib impedance out of range-currently 135 ohms.  Pt with possible RV lead fracture.  Seen 04/27/2024 by Dr. Marven Slimmer and plan for RV lead extraction and replacement.  Pt has HV therapies programmed on.   Per Dr. Benard Brackett [employer] tech services and determine if HV therapies should be disabled until procedure.  Outreach made to [employer].  Per representative, Pt could be at increased for inappropriate therapies.  They recommend disabling HV therapies until lead can be replaced.  Dr. Marven Slimmer informed of above.  Per Dr. Lambert-disable HV therapies.  Outreach made to Pt.  Advised Pt of above.  Will plan to see Pt tomorrow 05/03/2024 at 12:00 pm to disable therapies and alarms.  Appointment scheduled.

## 2024-05-03 ENCOUNTER — Telehealth: Payer: Self-pay

## 2024-05-03 ENCOUNTER — Ambulatory Visit: Payer: Self-pay | Admitting: Cardiology

## 2024-05-03 ENCOUNTER — Ambulatory Visit: Attending: Cardiology

## 2024-05-03 DIAGNOSIS — I5022 Chronic systolic (congestive) heart failure: Secondary | ICD-10-CM

## 2024-05-03 DIAGNOSIS — T82198A Other mechanical complication of other cardiac electronic device, initial encounter: Secondary | ICD-10-CM

## 2024-05-03 LAB — CUP PACEART INCLINIC DEVICE CHECK
Date Time Interrogation Session: 20250613122407
Date Time Interrogation Session: 20250619122852
Implantable Lead Connection Status: 753985
Implantable Lead Connection Status: 753985
Implantable Lead Implant Date: 20210714
Implantable Lead Implant Date: 20210714
Implantable Lead Location: 753860
Implantable Lead Location: 753860
Implantable Pulse Generator Implant Date: 20210714
Implantable Pulse Generator Implant Date: 20210714

## 2024-05-03 NOTE — Progress Notes (Signed)
 Pt seen to disable HV therapies per Dr. Marven Slimmer d/t lead malfunction. See attachment for details.  Pt given printed surgical instructions for lead extraction/reimplant.

## 2024-05-03 NOTE — Telephone Encounter (Signed)
 Spoke with patient to complete pre-procedure call.     New medical conditions? No Recent hospitalizations or surgeries? No Started any new medications? No Patient made aware to contact office to inform of any new medications started. Any changes in activities of daily living? No  Pre-procedure testing scheduled: CT on TBD and lab work on 7/2 in [place].  Confirmed patient is scheduled for  ICD generator change with RV Lead Extraction and Reimplant on Thursday, July 24 with Dr. Harvie Liner. Instructed patient to arrive at the Main Entrance A at [facility]: [address] [place], [place] [address] and check in at Admitting at 1:00 PM  Advised of plan to stay overnight. You MUST have a responsible adult to drive you home and MUST be with you the first 24 hours after you arrive home or your procedure could be cancelled.  Patient verbalized understanding to information provided and is agreeable to proceed with procedure.

## 2024-05-08 ENCOUNTER — Ambulatory Visit

## 2024-05-08 ENCOUNTER — Ambulatory Visit: Admitting: Cardiology

## 2024-05-08 VITALS — BP 132/64 | HR 75 | Ht 70.0 in | Wt 170.2 lb

## 2024-05-08 DIAGNOSIS — I6523 Occlusion and stenosis of bilateral carotid arteries: Secondary | ICD-10-CM | POA: Diagnosis not present

## 2024-05-08 DIAGNOSIS — I5042 Chronic combined systolic (congestive) and diastolic (congestive) heart failure: Secondary | ICD-10-CM

## 2024-05-08 MED ORDER — LOKELMA 10 G PO PACK: 10.0000 g | PACK | ORAL | 3 refills | Status: AC

## 2024-05-08 NOTE — Progress Notes (Signed)
 Cardiology Consultation:    Date:  05/08/2024   ID:  Kenneth Pham, DOB [birth year], MRN [id number]  PCP:  Kenneth Lamar CROME, MD  Cardiologist:  Kenneth SAUNDERS Dennies Coate, MD   Referring MD: Kenneth Lamar CROME, MD   No chief complaint on file.    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  Tendency for hyperkalemia and remains on Lokelma .  Here for follow-up visit Problem List Items Addressed This Visit     Chronic CHF (congestive heart failure) (HCC) - Primary   CHF with reduced LVEF, nonischemic cardiomyopathy diagnosed during 2020 at [facility] with EF 20%. Last echocardiogram from September 2020 with EF 30 to 35% diffuse hypokinesis and normal RV function.  No subsequent echocardiogram available.  Remains euvolemic and compensated. Continue with salt restriction to below 2 g/day and fluid restriction to below 2 L/day. Weight today 170 pounds and mentions hangs around this weight mostly.  Uses furosemide  40 mg as needed for weight gain or ankle edema.  Guideline-directed medical therapy limited due to hyperkalemia. Continue with carvedilol  25 mg twice daily Continue Farxiga  10 mg once daily Continue Entresto  97 mg - 103 mg twice daily. Unable to add spironolactone  or eplerenone given hyperkalemia and requirements for Lokelma  on a regular basis.  Continue Lokelma  10 g twice a week.        Bilateral carotid artery stenosis   Has had no follow-up imaging recently. Last study was back in 2020 at the time of his  stroke.  Continue aspirin 81 mg once daily Continue atorvastatin  80 mg once daily.  Will obtain ultrasound of the carotids for any significant interval change.       History of ICD implant in 2021, now pending revision July 2025 in the setting of RV noise.  Return to clinic for follow-up with us  tentatively in 4 months.   History of Present Illness:    Kenneth Pham is a 68 y.o. male who is being seen today for full follow-up visit. PCP is [name], Lamar CROME, MD. Last visit with cardiology was 04-27-2024 with Dr. Cindie at [facility] Follows up with Dr. Monetta at our office and last visit with him was in October 2024. Follows up with Dr. Inocencio in EP  Pleasant man lives in a room rented out from his landlord and his brother also lives in the same house.  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  Tendency for hyperkalemia and remains on Lokelma .  Last device check 05/03/2024 and following up with the EP and pending RV lead extraction and reimplantation July 24.  Last stress test with nuclear imaging January 2021 showed no ischemia.  Last echocardiogram to review is from September 2020 with LVEF 30 to 35% diffuse hypokinesis, RV  normal function,   Mentions overall he has been doing well.  Denies any chest pain, shortness of breath, orthopnea, paroxysmal nocturnal dyspnea. Uses furosemide  as needed for weight gain over 3 pounds from day-to-day basis.  Denies any blood in urine or stools. Denies any pedal edema at this time takes furosemide  when he does not notice any pedal  edema.  Does not drink alcohol. No smoking. No other drug abuse. Mentions weight has been mostly steady around 170 pounds.   blood work from 04/16/2024 notes BUN 16, creatinine 1.34 and EGFR 58. Sodium 143 potassium 4.7  Past Medical History:  Diagnosis Date   Acute CHF (HCC) 05/16/2019   Alcohol dependence in remission (HCC) 05/16/2019   Arteriosclerosis of carotid artery, right 05/15/2019   Bilateral carotid artery stenosis 05/15/2019   Cerebellar stroke, acute (HCC) 05/09/2019   Chronic anticoagulation 07/13/2019   CKD (chronic kidney disease) stage 2, GFR 60-89 ml/min 05/29/2019   Dilated cardiomyopathy (HCC) 05/29/2019   Heart failure with reduced ejection fraction (HCC) 05/09/2019   History of stroke involving cerebellum 05/09/2019   Hypercholesteremia    Hypertension    Hypertensive heart disease with heart failure (HCC) 05/29/2019   Nonsustained ventricular tachycardia (HCC) 06/19/2020   Formatting of this note might be different from the original. 2021: ICD   Precordial chest pain 08/21/2019   Prediabetes 06/08/2019   Formatting of this note might be different from the original. [facility] 05/29/2019: A1C 5.8%   Pulmonary nodules 05/15/2019   05/01/2019 CT angio - 8.10mm bandlike nodular density lingula - 5.35mm R middle lobe - 61.5 pack year hx, rec 3 month f/u   Shortness of breath 08/21/2019   Snores 05/29/2019   Stage 3a chronic kidney disease (HCC) 05/29/2019   Viral respiratory infection 10/16/2021   Formatting of this note might be different from the original. 10/16/2021: COVID negative    Past Surgical History:  Procedure Laterality Date   APPENDECTOMY     CATARACT EXTRACTION Right    ICD IMPLANT N/A 05/28/2020   Procedure: ICD IMPLANT;  Surgeon: Inocencio Soyla Lunger, MD;  Location: [facility] INVASIVE CV LAB;  Service: Cardiovascular;  Laterality: N/A;   TONSILLECTOMY      Current Medications: Current Meds  Medication Sig   albuterol  (VENTOLIN  HFA) 108 (90 Base) MCG/ACT inhaler Inhale 2  puffs into the lungs every 6 (six) hours as needed for wheezing or shortness of breath. TAKE 2 PUFFS BY MOUTH EVERY 6 HOURS AS NEEDED FOR WHEEZE OR SHORTNESS OF BREATH   aspirin EC 81 MG tablet Take 81 mg by mouth daily.    atorvastatin  (LIPITOR) 80 MG tablet TAKE 1 TABLET BY MOUTH EVERY DAY   carvedilol  (COREG ) 25 MG tablet TAKE 1 TABLET (25 MG TOTAL) BY MOUTH 2 (TWO) TIMES DAILY. DOSE INCREASE   dapagliflozin  propanediol (FARXIGA ) 10 MG TABS tablet Take 1 tablet (10 mg total) by mouth daily. TAKE 1 TABLET BY MOUTH EVERY DAY BEFORE BREAKFAST   ENTRESTO  97-103 MG TAKE 1 TABLET BY MOUTH TWICE A DAY   furosemide  (LASIX ) 40 MG tablet TAKE 1 TABLET (40 MG TOTAL) BY MOUTH DAILY AS NEEDED (FOR WEIGHT GAIN 3 POUNDS ABOVE BASELINE).   [DISCONTINUED] sodium zirconium cyclosilicate  (LOKELMA ) 10 g PACK packet Take 10 g by mouth 2 (two) times a week.     Allergies:   Patient has no known allergies.   Social History   Socioeconomic History   Marital status: Widowed    Spouse name: Not on file   Number  of children: Not on file   Years of education: Not on file   Highest education level: Not on file  Occupational History   Not on file  Tobacco Use   Smoking status: Former    Current packs/day: 0.00    Types: Cigarettes    Quit date: 04/29/2019    Years since quitting: 5.0   Smokeless tobacco: Never  Vaping Use   Vaping status: Never Used  Substance and Sexual Activity   Alcohol use: Not Currently   Drug use: Not Currently    Types: Marijuana   Sexual activity: Yes  Other Topics Concern   Not on file  Social History Narrative   Not on file   Social Drivers of Health   Financial Resource Strain: Not on file  Food Insecurity: Low Risk  (09/19/2023)   Received from [facility]   Hunger Vital Sign    Within the past 12 months, you worried that your food would run out before you got money to buy more: Never true    Within the past 12 months, the food you bought just didn't last and you  didn't have money to get more. : Never true  Transportation Needs: No Transportation Needs (09/19/2023)   Received from [facility]    In the past 12 months, has lack of reliable transportation kept you from medical appointments, meetings, work or from getting things needed for daily living? : No  Physical Activity: Not on file  Stress: Not on file  Social Connections: Not on file     Family History: The patient's family history includes Cancer in his father; Heart Problems in his brother; Stroke in his mother. ROS:   Please see the history of present illness.    All 14 point review of systems negative except as described per history of present illness.  EKGs/Labs/Other Studies Reviewed:    The following studies were reviewed today:   EKG:       Recent Labs: 08/24/2023: ALT 17; Hemoglobin 13.7; Platelets 261 04/16/2024: BUN 16; Creatinine, Ser 1.34; Potassium 4.7; Sodium 143  Recent Lipid Panel    Component Value Date/Time   CHOL 172 08/24/2023 1327   TRIG 166 (H) 08/24/2023 1327   HDL 46 08/24/2023 1327   CHOLHDL 3.7 08/24/2023 1327   LDLCALC 97 08/24/2023 1327    Physical Exam:    VS:  BP 132/64   Pulse 75   Ht 5' 10 (1.778 m)   Wt 170 lb 3.2 oz (77.2 kg)   SpO2 95%   BMI 24.42 kg/m     Wt Readings from Last 3 Encounters:  05/08/24 170 lb 3.2 oz (77.2 kg)  04/27/24 171 lb (77.6 kg)  04/16/24 173 lb (78.5 kg)     GENERAL:  Well nourished, well developed in no acute distress NECK: No JVD; No carotid bruits CARDIAC: RRR, S1 and S2 present, no murmurs, no rubs, no gallops.  Left infraclavicular ICD device generator palpable. CHEST:  Clear to auscultation without rales, wheezing or rhonchi  Extremities: No pitting pedal edema. Pulses bilaterally symmetric with radial 2+ and dorsalis pedis 2+ NEUROLOGIC:  Alert and oriented x 3  Medication Adjustments/Labs and Tests Ordered: Current medicines are reviewed at length with the patient today.   Concerns regarding medicines are outlined above.  No orders of the defined types were placed in this encounter.  Meds ordered this encounter  Medications   sodium zirconium cyclosilicate  (LOKELMA ) 10 g PACK packet  Sig: Take 10 g by mouth 2 (two) times a week.    Dispense:  25 packet    Refill:  3    Signed, Jamaira Sherk reddy Chantille Navarrete, MD, MPH, [facility]. 05/08/2024 9:53 AM    [facility]

## 2024-05-08 NOTE — Assessment & Plan Note (Signed)
 CHF with reduced LVEF, nonischemic cardiomyopathy diagnosed during 2020 at [facility] with EF 20%. Last echocardiogram from September 2020 with EF 30 to 35% diffuse hypokinesis and normal RV function.  No subsequent echocardiogram available.  Remains euvolemic and compensated. Continue with salt restriction to below 2 g/day and fluid restriction to below 2 L/day. Weight today 170 pounds and mentions hangs around this weight mostly.  Uses furosemide  40 mg as needed for weight gain or ankle edema.  Guideline-directed medical therapy limited due to hyperkalemia. Continue with carvedilol  25 mg twice daily Continue Farxiga  10 mg once daily Continue Entresto  97 mg - 103 mg twice daily. Unable to add spironolactone  or eplerenone given hyperkalemia and requirements for Lokelma  on a regular basis.  Continue Lokelma  10 g twice a week.

## 2024-05-08 NOTE — Patient Instructions (Signed)
 Medication Instructions:  Your physician recommends that you continue on your current medications as directed. Please refer to the Current Medication list given to you today.  *If you need a refill on your cardiac medications before your next appointment, please call your pharmacy*  Lab Work: Your physician recommends that you return for lab work in:   Labs 1 week before next appointment: Lipid, CMP  If you have labs (blood work) drawn today and your tests are completely normal, you will receive your results only by: MyChart Message (if you have MyChart) OR A paper copy in the mail If you have any lab test that is abnormal or we need to change your treatment, we will call you to review the results.  Testing/Procedures: Your physician has requested that you have an echocardiogram. Echocardiography is a painless test that uses sound waves to create images of your heart. It provides your doctor with information about the size and shape of your heart and how well your heart's chambers and valves are working. This procedure takes approximately one hour. There are no restrictions for this procedure. Please do NOT wear cologne, perfume, aftershave, or lotions (deodorant is allowed). Please arrive 15 minutes prior to your appointment time.  Please note: We ask at that you not bring children with you during ultrasound (echo/ vascular) testing. Due to room size and safety concerns, children are not allowed in the ultrasound rooms during exams. Our front office staff cannot provide observation of children in our lobby area while testing is being conducted. An adult accompanying a patient to their appointment will only be allowed in the ultrasound room at the discretion of the ultrasound technician under special circumstances. We apologize for any inconvenience.  Your physician has requested that you have a carotid duplex. This test is an ultrasound of the carotid arteries in your neck. It looks at blood  flow through these arteries that supply the brain with blood. Allow one hour for this exam. There are no restrictions or special instructions.   Follow-Up: At [facility], you and your health needs are our priority.  As part of our continuing mission to provide you with exceptional heart care, our providers are all part of one team.  This team includes your primary Cardiologist (physician) and Advanced Practice Providers or APPs (Physician Assistants and Nurse Practitioners) who all work together to provide you with the care you need, when you need it.  Your next appointment:   4 month(s)  Provider:   Alean Kobus, MD    We recommend signing up for the patient portal called MyChart.  Sign up information is provided on this After Visit Summary.  MyChart is used to connect with patients for Virtual Visits (Telemedicine).  Patients are able to view lab/test results, encounter notes, upcoming appointments, etc.  Non-urgent messages can be sent to your provider as well.   To learn more about what you can do with MyChart, go to [email or web address].   Other Instructions None

## 2024-05-08 NOTE — Assessment & Plan Note (Signed)
 Dyslipidemia with total cholesterol from 08/24/2023 172, HDL 46, LDL 97 and triglycerides 166.  Continue atorvastatin  80 mg once daily.  Will check fasting lipid panel prior to next follow-up visit in 4 months and if LDL not at goal below 70 mg/dL, will add Zetia

## 2024-05-08 NOTE — Assessment & Plan Note (Signed)
 Has had no follow-up imaging recently. Last study was back in 2020 at the time of his stroke.  Continue aspirin 81 mg once daily Continue atorvastatin  80 mg once daily.  Will obtain ultrasound of the carotids for any significant interval change.

## 2024-05-16 ENCOUNTER — Ambulatory Visit: Admitting: Cardiology

## 2024-05-18 LAB — CBC WITH DIFFERENTIAL/PLATELET
Basophils Absolute: 0.1 x10E3/uL (ref 0.0–0.2)
Basos: 1 %
EOS (ABSOLUTE): 0.4 x10E3/uL (ref 0.0–0.4)
Eos: 6 %
Hematocrit: 37.5 % (ref 37.5–51.0)
Hemoglobin: 11.8 g/dL — ABNORMAL LOW (ref 13.0–17.7)
Immature Grans (Abs): 0 x10E3/uL (ref 0.0–0.1)
Immature Granulocytes: 0 %
Lymphocytes Absolute: 1.6 x10E3/uL (ref 0.7–3.1)
Lymphs: 25 %
MCH: 31 pg (ref 26.6–33.0)
MCHC: 31.5 g/dL (ref 31.5–35.7)
MCV: 98 fL — ABNORMAL HIGH (ref 79–97)
Monocytes Absolute: 0.4 x10E3/uL (ref 0.1–0.9)
Monocytes: 6 %
Neutrophils Absolute: 4 x10E3/uL (ref 1.4–7.0)
Neutrophils: 62 %
Platelets: 250 x10E3/uL (ref 150–450)
RBC: 3.81 x10E6/uL — ABNORMAL LOW (ref 4.14–5.80)
RDW: 12.3 % (ref 11.6–15.4)
WBC: 6.4 x10E3/uL (ref 3.4–10.8)

## 2024-05-18 LAB — COMPREHENSIVE METABOLIC PANEL WITH GFR
ALT: 10 IU/L (ref 0–44)
AST: 10 IU/L (ref 0–40)
Albumin: 4.2 g/dL (ref 3.9–4.9)
Alkaline Phosphatase: 69 IU/L (ref 44–121)
BUN/Creatinine Ratio: 12 (ref 10–24)
BUN: 15 mg/dL (ref 8–27)
Bilirubin Total: 0.2 mg/dL (ref 0.0–1.2)
CO2: 22 mmol/L (ref 20–29)
Calcium: 9.4 mg/dL (ref 8.6–10.2)
Chloride: 105 mmol/L (ref 96–106)
Creatinine, Ser: 1.29 mg/dL — ABNORMAL HIGH (ref 0.76–1.27)
Globulin, Total: 2.3 g/dL (ref 1.5–4.5)
Glucose: 103 mg/dL — ABNORMAL HIGH (ref 70–99)
Potassium: 4.4 mmol/L (ref 3.5–5.2)
Sodium: 142 mmol/L (ref 134–144)
Total Protein: 6.5 g/dL (ref 6.0–8.5)
eGFR: 61 mL/min/1.73 (ref >59)

## 2024-05-18 LAB — BASIC METABOLIC PANEL WITH GFR
BUN/Creatinine Ratio: 12 (ref 10–24)
BUN: 14 mg/dL (ref 8–27)
CO2: 21 mmol/L (ref 20–29)
Calcium: 9.6 mg/dL (ref 8.6–10.2)
Chloride: 106 mmol/L (ref 96–106)
Creatinine, Ser: 1.18 mg/dL (ref 0.76–1.27)
Glucose: 100 mg/dL — ABNORMAL HIGH (ref 70–99)
Potassium: 4.4 mmol/L (ref 3.5–5.2)
Sodium: 144 mmol/L (ref 134–144)
eGFR: 68 mL/min/1.73 (ref >59)

## 2024-05-18 LAB — LIPID PANEL
Chol/HDL Ratio: 3.5 ratio (ref 0.0–5.0)
Cholesterol, Total: 132 mg/dL (ref 100–199)
HDL: 38 mg/dL — ABNORMAL LOW (ref >39)
LDL Chol Calc (NIH): 76 mg/dL (ref 0–99)
Triglycerides: 97 mg/dL (ref 0–149)
VLDL Cholesterol Cal: 18 mg/dL (ref 5–40)

## 2024-05-22 ENCOUNTER — Ambulatory Visit (HOSPITAL_COMMUNITY)
Admission: RE | Admit: 2024-05-22 | Discharge: 2024-05-22 | Disposition: A | Discharge status: Home / Self Care | Source: Ambulatory Visit | Attending: Cardiology | Admitting: Cardiology

## 2024-05-22 VITALS — BP 174/69 | HR 65

## 2024-05-22 DIAGNOSIS — Y831 Surgical operation with implant of artificial internal device as the cause of abnormal reaction of the patient, or of later complication, without mention of misadventure at the time of the procedure: Secondary | ICD-10-CM | POA: Insufficient documentation

## 2024-05-22 DIAGNOSIS — T82198A Other mechanical complication of other cardiac electronic device, initial encounter: Secondary | ICD-10-CM

## 2024-05-22 DIAGNOSIS — I5022 Chronic systolic (congestive) heart failure: Secondary | ICD-10-CM | POA: Diagnosis not present

## 2024-05-22 DIAGNOSIS — Z95810 Presence of automatic (implantable) cardiac defibrillator: Secondary | ICD-10-CM | POA: Diagnosis not present

## 2024-05-22 DIAGNOSIS — I4729 Other ventricular tachycardia: Secondary | ICD-10-CM

## 2024-05-22 MED ORDER — IOHEXOL 350 MG/ML SOLN
100.0000 mL | Freq: Once | INTRAVENOUS | Status: AC | PRN
  Administered 2024-05-22: 100 mL via INTRAVENOUS

## 2024-05-24 ENCOUNTER — Ambulatory Visit (INDEPENDENT_AMBULATORY_CARE_PROVIDER_SITE_OTHER): Payer: Self-pay

## 2024-05-24 DIAGNOSIS — I420 Dilated cardiomyopathy: Secondary | ICD-10-CM | POA: Diagnosis not present

## 2024-05-24 LAB — CUP PACEART REMOTE DEVICE CHECK
Battery Remaining Longevity: 103 mo
Battery Voltage: 3.02 V
Brady Statistic RV Percent Paced: 0.07 %
Date Time Interrogation Session: 20250710012507
HighPow Impedance: 188 Ohm
Implantable Lead Connection Status: 753985
Implantable Lead Implant Date: 20210714
Implantable Lead Location: 753860
Implantable Pulse Generator Implant Date: 20210714
Lead Channel Impedance Value: 532 Ohm
Lead Channel Impedance Value: 627 Ohm
Lead Channel Pacing Threshold Amplitude: 0.625 V
Lead Channel Pacing Threshold Pulse Width: 0.4 ms
Lead Channel Sensing Intrinsic Amplitude: 10.75 mV
Lead Channel Sensing Intrinsic Amplitude: 10.75 mV
Lead Channel Setting Pacing Amplitude: 2 V
Lead Channel Setting Pacing Pulse Width: 0.4 ms
Lead Channel Setting Sensing Sensitivity: 0.3 mV
Zone Setting Status: 755011
Zone Setting Status: 755011
Zone Setting Status: 755011

## 2024-05-25 ENCOUNTER — Ambulatory Visit

## 2024-05-25 ENCOUNTER — Ambulatory Visit (INDEPENDENT_AMBULATORY_CARE_PROVIDER_SITE_OTHER)

## 2024-05-25 DIAGNOSIS — I5042 Chronic combined systolic (congestive) and diastolic (congestive) heart failure: Secondary | ICD-10-CM

## 2024-05-25 DIAGNOSIS — I6523 Occlusion and stenosis of bilateral carotid arteries: Secondary | ICD-10-CM

## 2024-05-25 LAB — ECHOCARDIOGRAM COMPLETE
AR max vel: 2.38 cm2
AV Area VTI: 2.44 cm2
AV Area mean vel: 2.16 cm2
AV Mean grad: 4.5 mmHg
AV Peak grad: 7.7 mmHg
Ao pk vel: 1.39 m/s
Area-P 1/2: 2.23 cm2
Calc EF: 49.8 %
MV M vel: 5.54 m/s
MV Peak grad: 122.8 mmHg
MV VTI: 1.32 cm2
S' Lateral: 4.1 cm
Single Plane A2C EF: 49.8 %
Single Plane A4C EF: 54.7 %

## 2024-05-30 ENCOUNTER — Ambulatory Visit: Payer: Self-pay | Admitting: Cardiology

## 2024-05-31 ENCOUNTER — Telehealth (HOSPITAL_COMMUNITY): Payer: Self-pay

## 2024-05-31 NOTE — Telephone Encounter (Signed)
 Attempted to reach patient to discuss upcoming procedure, no answer.

## 2024-06-04 ENCOUNTER — Ambulatory Visit: Attending: Cardiology

## 2024-06-04 DIAGNOSIS — I5022 Chronic systolic (congestive) heart failure: Secondary | ICD-10-CM

## 2024-06-04 DIAGNOSIS — Z95810 Presence of automatic (implantable) cardiac defibrillator: Secondary | ICD-10-CM | POA: Diagnosis not present

## 2024-06-05 ENCOUNTER — Encounter: Payer: Self-pay | Admitting: Emergency Medicine

## 2024-06-06 NOTE — Pre-Procedure Instructions (Addendum)
Attempted to call patient regarding procedure instructions.  Left voice mail on the following items: Arrival time 1230 Nothing to eat or drink after midnight No meds AM of procedure Responsible person to drive you home and stay with you for 24 hrs Wash with special soap night before and morning of procedure  

## 2024-06-07 ENCOUNTER — Ambulatory Visit (HOSPITAL_COMMUNITY): Admitting: Certified Registered"

## 2024-06-07 ENCOUNTER — Ambulatory Visit (HOSPITAL_COMMUNITY)
Admission: RE | Disposition: A | Discharge status: Home / Self Care | Payer: Self-pay | Source: Home / Self Care | Attending: Cardiology

## 2024-06-07 ENCOUNTER — Ambulatory Visit (HOSPITAL_COMMUNITY)

## 2024-06-07 ENCOUNTER — Other Ambulatory Visit: Payer: Self-pay

## 2024-06-07 ENCOUNTER — Encounter (HOSPITAL_COMMUNITY): Payer: Self-pay | Admitting: Cardiology

## 2024-06-07 ENCOUNTER — Ambulatory Visit (HOSPITAL_COMMUNITY)
Admission: RE | Admit: 2024-06-07 | Discharge: 2024-06-08 | Disposition: A | Discharge status: Home / Self Care | Attending: Cardiology | Admitting: Cardiology

## 2024-06-07 DIAGNOSIS — Y831 Surgical operation with implant of artificial internal device as the cause of abnormal reaction of the patient, or of later complication, without mention of misadventure at the time of the procedure: Secondary | ICD-10-CM | POA: Insufficient documentation

## 2024-06-07 DIAGNOSIS — T82110A Breakdown (mechanical) of cardiac electrode, initial encounter: Secondary | ICD-10-CM | POA: Diagnosis present

## 2024-06-07 DIAGNOSIS — E785 Hyperlipidemia, unspecified: Secondary | ICD-10-CM | POA: Diagnosis not present

## 2024-06-07 DIAGNOSIS — N1831 Chronic kidney disease, stage 3a: Secondary | ICD-10-CM | POA: Diagnosis not present

## 2024-06-07 DIAGNOSIS — Z8673 Personal history of transient ischemic attack (TIA), and cerebral infarction without residual deficits: Secondary | ICD-10-CM | POA: Insufficient documentation

## 2024-06-07 DIAGNOSIS — I5022 Chronic systolic (congestive) heart failure: Secondary | ICD-10-CM | POA: Insufficient documentation

## 2024-06-07 DIAGNOSIS — I3139 Other pericardial effusion (noninflammatory): Secondary | ICD-10-CM | POA: Insufficient documentation

## 2024-06-07 DIAGNOSIS — I130 Hypertensive heart and chronic kidney disease with heart failure and stage 1 through stage 4 chronic kidney disease, or unspecified chronic kidney disease: Secondary | ICD-10-CM | POA: Insufficient documentation

## 2024-06-07 DIAGNOSIS — F1011 Alcohol abuse, in remission: Secondary | ICD-10-CM | POA: Diagnosis not present

## 2024-06-07 DIAGNOSIS — T82118A Breakdown (mechanical) of other cardiac electronic device, initial encounter: Secondary | ICD-10-CM | POA: Diagnosis not present

## 2024-06-07 DIAGNOSIS — Z01818 Encounter for other preprocedural examination: Secondary | ICD-10-CM

## 2024-06-07 DIAGNOSIS — N183 Chronic kidney disease, stage 3 unspecified: Secondary | ICD-10-CM | POA: Diagnosis not present

## 2024-06-07 DIAGNOSIS — I428 Other cardiomyopathies: Secondary | ICD-10-CM | POA: Diagnosis not present

## 2024-06-07 HISTORY — PX: ICD GENERATOR CHANGEOUT: EP1231

## 2024-06-07 HISTORY — PX: LEAD INSERTION: EP1212

## 2024-06-07 HISTORY — PX: LEAD EXTRACTION: EP1211

## 2024-06-07 HISTORY — DX: Presence of automatic (implantable) cardiac defibrillator: Z95.810

## 2024-06-07 LAB — PREPARE RBC (CROSSMATCH)

## 2024-06-07 LAB — SURGICAL PCR SCREEN
MRSA, PCR: NEGATIVE
Staphylococcus aureus: NEGATIVE

## 2024-06-07 LAB — ABO/RH: ABO/RH(D): A POS

## 2024-06-07 SURGERY — LEAD EXTRACTION
Anesthesia: General

## 2024-06-07 MED ORDER — CLEVIDIPINE BUTYRATE 0.5 MG/ML IV EMUL
INTRAVENOUS | Status: DC | PRN
Start: 2024-06-07 — End: 2024-06-07
  Administered 2024-06-07: 2 mg/h via INTRAVENOUS

## 2024-06-07 MED ORDER — POVIDONE-IODINE 10 % EX SWAB
2.0000 | Freq: Once | CUTANEOUS | Status: AC
  Administered 2024-06-07: 2 via TOPICAL

## 2024-06-07 MED ORDER — ROCURONIUM BROMIDE 10 MG/ML (PF) SYRINGE
PREFILLED_SYRINGE | INTRAVENOUS | Status: DC | PRN
  Administered 2024-06-07 (×2): 50 mg via INTRAVENOUS

## 2024-06-07 MED ORDER — SODIUM CHLORIDE 0.9 % IV SOLN
80.0000 mg | INTRAVENOUS | Status: AC
  Administered 2024-06-07: 80 mg

## 2024-06-07 MED ORDER — PHENYLEPHRINE HCL-NACL 20-0.9 MG/250ML-% IV SOLN
INTRAVENOUS | Status: DC | PRN
  Administered 2024-06-07: 20 ug/min via INTRAVENOUS

## 2024-06-07 MED ORDER — SODIUM CHLORIDE 0.9 % IV SOLN: INTRAVENOUS | Status: DC

## 2024-06-07 MED ORDER — PHENYLEPHRINE 80 MCG/ML (10ML) SYRINGE FOR IV PUSH (FOR BLOOD PRESSURE SUPPORT)
PREFILLED_SYRINGE | INTRAVENOUS | Status: DC | PRN
  Administered 2024-06-07 (×2): 80 ug via INTRAVENOUS

## 2024-06-07 MED ORDER — LACTATED RINGERS IV SOLN: INTRAVENOUS | Status: DC

## 2024-06-07 MED ORDER — PROPOFOL 10 MG/ML IV BOLUS
INTRAVENOUS | Status: DC | PRN
  Administered 2024-06-07: 120 mg via INTRAVENOUS

## 2024-06-07 MED ORDER — FENTANYL CITRATE (PF) 100 MCG/2ML IJ SOLN
INTRAMUSCULAR | Status: AC
  Filled 2024-06-07: qty 2

## 2024-06-07 MED ORDER — SODIUM CHLORIDE 0.9 % IV SOLN
INTRAVENOUS | Status: AC
  Filled 2024-06-07: qty 2

## 2024-06-07 MED ORDER — ONDANSETRON HCL 4 MG/2ML IJ SOLN
INTRAMUSCULAR | Status: DC | PRN
  Administered 2024-06-07: 4 mg via INTRAVENOUS

## 2024-06-07 MED ORDER — SACUBITRIL-VALSARTAN 97-103 MG PO TABS
1.0000 | ORAL_TABLET | Freq: Two times a day (BID) | ORAL | Status: DC
  Administered 2024-06-08: 1 via ORAL
  Filled 2024-06-07: qty 1

## 2024-06-07 MED ORDER — ALBUTEROL SULFATE (2.5 MG/3ML) 0.083% IN NEBU
INHALATION_SOLUTION | RESPIRATORY_TRACT | Status: AC
  Filled 2024-06-07: qty 3

## 2024-06-07 MED ORDER — ALBUMIN HUMAN 5 % IV SOLN
INTRAVENOUS | Status: DC | PRN
Start: 2024-06-07 — End: 2024-06-07

## 2024-06-07 MED ORDER — FENTANYL CITRATE (PF) 250 MCG/5ML IJ SOLN
INTRAMUSCULAR | Status: DC | PRN
  Administered 2024-06-07: 50 ug via INTRAVENOUS

## 2024-06-07 MED ORDER — CHLORHEXIDINE GLUCONATE 4 % EX SOLN
4.0000 | Freq: Once | CUTANEOUS | Status: DC
  Filled 2024-06-07: qty 60

## 2024-06-07 MED ORDER — ONDANSETRON HCL 4 MG/2ML IJ SOLN: 4.0000 mg | Freq: Four times a day (QID) | INTRAMUSCULAR | Status: DC | PRN

## 2024-06-07 MED ORDER — SUGAMMADEX SODIUM 200 MG/2ML IV SOLN
INTRAVENOUS | Status: DC | PRN
  Administered 2024-06-07: 200 mg via INTRAVENOUS

## 2024-06-07 MED ORDER — SODIUM CHLORIDE 0.9% IV SOLUTION: Freq: Once | INTRAVENOUS | Status: DC

## 2024-06-07 MED ORDER — CEFAZOLIN SODIUM-DEXTROSE 2-4 GM/100ML-% IV SOLN
INTRAVENOUS | Status: AC
Start: 2024-06-07 — End: 2024-06-07
  Filled 2024-06-07: qty 100

## 2024-06-07 MED ORDER — LIDOCAINE 2% (20 MG/ML) 5 ML SYRINGE
INTRAMUSCULAR | Status: DC | PRN
  Administered 2024-06-07: 60 mg via INTRAVENOUS

## 2024-06-07 MED ORDER — CARVEDILOL 25 MG PO TABS
25.0000 mg | ORAL_TABLET | Freq: Two times a day (BID) | ORAL | Status: DC
  Administered 2024-06-07 – 2024-06-08 (×2): 25 mg via ORAL
  Filled 2024-06-07 (×2): qty 1

## 2024-06-07 MED ORDER — CEFAZOLIN SODIUM-DEXTROSE 2-4 GM/100ML-% IV SOLN
2.0000 g | INTRAVENOUS | Status: AC
  Administered 2024-06-07: 2 g via INTRAVENOUS

## 2024-06-07 MED ORDER — DEXAMETHASONE SODIUM PHOSPHATE 10 MG/ML IJ SOLN
INTRAMUSCULAR | Status: DC | PRN
  Administered 2024-06-07: 10 mg via INTRAVENOUS

## 2024-06-07 MED ORDER — ACETAMINOPHEN 325 MG PO TABS
325.0000 mg | ORAL_TABLET | ORAL | Status: DC | PRN
  Administered 2024-06-08: 650 mg via ORAL
  Filled 2024-06-07: qty 2

## 2024-06-07 MED ORDER — ATORVASTATIN CALCIUM 80 MG PO TABS
80.0000 mg | ORAL_TABLET | Freq: Every day | ORAL | Status: DC
  Administered 2024-06-08: 80 mg via ORAL
  Filled 2024-06-07: qty 1

## 2024-06-07 MED ORDER — ALBUTEROL SULFATE (2.5 MG/3ML) 0.083% IN NEBU
2.5000 mg | INHALATION_SOLUTION | Freq: Once | RESPIRATORY_TRACT | Status: AC
  Administered 2024-06-07: 2.5 mg via RESPIRATORY_TRACT

## 2024-06-07 MED ORDER — LIDOCAINE HCL 1 % IJ SOLN
INTRAMUSCULAR | Status: AC
  Filled 2024-06-07: qty 60

## 2024-06-07 MED ORDER — HYDROCODONE-ACETAMINOPHEN 5-325 MG PO TABS: 1.0000 | ORAL_TABLET | ORAL | Status: DC | PRN

## 2024-06-07 SURGICAL SUPPLY — 15 items
BALLOON OCL BRIDGE 80X90X.9 60 (BALLOONS) IMPLANT
CABLE SURGICAL S-101-97-12 (CABLE) ×1 IMPLANT
CLOSURE PERCLOSE PROSTYLE (VASCULAR PRODUCTS) IMPLANT
DEVICE LCKNG LEAD CARDIAC (CATHETERS) IMPLANT
KIT BRIDGE PREP (KITS) IMPLANT
KIT WRENCH (KITS) IMPLANT
LEAD SPRINT QUAT SEC 6935M-62 (Lead) IMPLANT
PAD DEFIB RADIO PHYSIO CONN (PAD) ×1 IMPLANT
POUCH AIGIS-R ANTIBACT ICD LRG (Mesh General) IMPLANT
SHEATH 9FR PRELUDE SNAP 13 (SHEATH) IMPLANT
SHEATH LASER 14 FR GLIDELIGHT (SHEATH) IMPLANT
SHEATH PINNACLE 6F 10CM (SHEATH) IMPLANT
SHEATH TIGHTRAIL MECH 13F (SHEATH) IMPLANT
TRAY PACEMAKER INSERTION (PACKS) ×1 IMPLANT
WIRE HI TORQ VERSACORE-J 145CM (WIRE) IMPLANT

## 2024-06-07 NOTE — Anesthesia Procedure Notes (Signed)
 Arterial Line Insertion Start/End7/24/2025 1:40 PM, 06/07/2024 1:45 AM Performed by: Maryclare Cornet, MD, Delores Dus, CRNA, CRNA  Patient location: Pre-op. Preanesthetic checklist: patient identified, IV checked, site marked, risks and benefits discussed, surgical consent, monitors and equipment checked, pre-op evaluation, timeout performed and anesthesia consent Lidocaine  1% used for infiltration Right, radial was placed Catheter size: 20 G Hand hygiene performed  and maximum sterile barriers used   Attempts: 1 Procedure performed using ultrasound guided technique. Ultrasound Notes:anatomy identified, needle tip was noted to be adjacent to the nerve/plexus identified and no ultrasound evidence of intravascular and/or intraneural injection Following insertion, dressing applied. Post procedure assessment: normal and unchanged  Patient tolerated the procedure well with no immediate complications.

## 2024-06-07 NOTE — Progress Notes (Signed)
   [address], [place] [address]             [phone]   I provided on site surgical backup for lead extraction from [id number]  Elspeth C. Kerrin, MD [facility] [phone]

## 2024-06-07 NOTE — Progress Notes (Signed)
 Osborn Daring, CRNA spoke with Dr. Maryclare in reference to patient's wheezing.  Patient does use albuterol  at home as needed.  Patient states that he does not need it often.  Dr. Maryclare ordered an albuterol  treatment.  Respiratory was notified and will be coming to give the patient the nebulizer treatment.

## 2024-06-07 NOTE — Transfer of Care (Signed)
 Immediate Anesthesia Transfer of Care Note  Patient: Kenneth Pham  Procedure(s) Performed: LEAD EXTRACTION LEAD INSERTION ICD GENERATOR CHANGEOUT  Patient Location: PACU and Cath Lab  Anesthesia Type:General  Level of Consciousness: awake, alert , and oriented  Airway & Oxygen Therapy: Patient Spontanous Breathing and Patient connected to nasal cannula oxygen  Post-op Assessment: Report given to RN and Post -op Vital signs reviewed and stable  Post vital signs: Reviewed and stable  Last Vitals:  Vitals Value Taken Time  BP 118/75 06/07/24 16:47  Temp    Pulse 74 06/07/24 16:54  Resp 21 06/07/24 16:54  SpO2 95 % 06/07/24 16:54  Vitals shown include unfiled device data.  Last Pain:  Vitals:   06/07/24 1310  PainSc: 0-No pain      Patients Stated Pain Goal: 0 (06/07/24 1310)  Complications: No notable events documented.

## 2024-06-07 NOTE — Progress Notes (Signed)
 Bedrest end time 2011. Called Cath Lab for the start time at 1611.

## 2024-06-07 NOTE — Anesthesia Procedure Notes (Signed)
 Procedure Name: Intubation Date/Time: 06/07/2024 2:23 PM  Performed by: Delores Dus, CRNAPre-anesthesia Checklist: Patient identified, Emergency Drugs available, Suction available and Patient being monitored Patient Re-evaluated:Patient Re-evaluated prior to induction Oxygen Delivery Method: Circle system utilized Preoxygenation: Pre-oxygenation with 100% oxygen Induction Type: IV induction Ventilation: Mask ventilation without difficulty Laryngoscope Size: Miller and 2 Grade View: Grade I Tube type: Oral Tube size: 7.5 mm Number of attempts: 1 Airway Equipment and Method: Stylet and Oral airway Placement Confirmation: ETT inserted through vocal cords under direct vision, positive ETCO2 and breath sounds checked- equal and bilateral Secured at: 22 cm Tube secured with: Tape Dental Injury: Teeth and Oropharynx as per pre-operative assessment

## 2024-06-07 NOTE — Anesthesia Preprocedure Evaluation (Signed)
 Anesthesia Evaluation  Patient identified by MRN, date of birth, ID band Patient awake    Reviewed: Allergy & Precautions, H&P , NPO status , Patient's Chart, lab work & pertinent test results  Airway Mallampati: II   Neck ROM: full    Dental   Pulmonary shortness of breath, former smoker   breath sounds clear to auscultation       Cardiovascular hypertension, +CHF  + dysrhythmias Ventricular Tachycardia + Cardiac Defibrillator  Rhythm:regular Rate:Normal  EF 30-35%   Neuro/Psych CVA    GI/Hepatic   Endo/Other    Renal/GU      Musculoskeletal   Abdominal   Peds  Hematology   Anesthesia Other Findings   Reproductive/Obstetrics                              Anesthesia Physical Anesthesia Plan  ASA: 4  Anesthesia Plan: General   Post-op Pain Management:    Induction: Intravenous  PONV Risk Score and Plan: 2 and Ondansetron , Dexamethasone , Midazolam  and Treatment may vary due to age or medical condition  Airway Management Planned: Oral ETT  Additional Equipment: Arterial line and TEE  Intra-op Plan:   Post-operative Plan: Extubation in OR  Informed Consent: I have reviewed the patients History and Physical, chart, labs and discussed the procedure including the risks, benefits and alternatives for the proposed anesthesia with the patient or authorized representative who has indicated his/her understanding and acceptance.     Dental advisory given  Plan Discussed with: CRNA, Anesthesiologist and Surgeon  Anesthesia Plan Comments:         Anesthesia Quick Evaluation

## 2024-06-07 NOTE — Progress Notes (Signed)
 EPIC Encounter for ICM Monitoring  [name] is a 68 y.o. male Date: 06/07/2024 Primary Care Physican: Ofilia Lamar CROME, MD Primary Cardiologist: Madireddy                                           Electrophysiologist: Inocencio 01/26/2024 Weight: 169-170 lbs      04/02/2024 Weight: 170 lbs  04/11/2024 Weight: Home scale is broke   05/08/2024 Weight: 170 lbs                                                   Transmission results reviewed.  Pt having lead extraction 7/24   Optivol thoracic impedance suggesting days of possible dryness starting 6/2.   Prescribed:  Furosemide  40 mg take 1 tablet(s) (40 mg total) by mouth as needed (take only when weight goes up 3 lbs from baseline).   Labs: 05/17/2024 Creatinine 1.18, BUN 14, Potassium 4.4, Sodium 144, GFR 68 04/16/2024 Creatinine 1.34, BUN 16, Potassium 4.7, Sodium 143, GFR 58 A complete set of results can be found in Results Review.   Recommendations:  No changes.   Follow-up plan: ICM clinic phone appointment on 07/09/2024.   91 day device clinic remote transmission 08/23/2024.     EP/Cardiology Office Visits: 09/05/2024 with Dr. Liborio.   09/13/2024 with Dr Cindie.   Copy of ICM check sent to Dr. Inocencio.   3 month ICM trend: 06/04/2024.    12-14 Month ICM trend:     [name], RN 06/07/2024 10:28 AM

## 2024-06-07 NOTE — H&P (Signed)
 Electrophysiology Office Follow up Visit Note:     Date:  06/07/2024    ID:  Lynwood Rakers, DOB 06/30/1956, MRN [id number]   PCP:  Ofilia Lamar CROME, MD    [facility] Cardiologist:  Redell Leiter, MD  [facility] Electrophysiologist:  Will Gladis Norton, MD      Interval History:       Jalik Gellatly is a 68 y.o. male who presents for a follow up visit.    Mr. Canupp presents to discuss RV lead integrity alert.  He has a SIC counter of 67. The patient last saw Dr. Norton April 16, 2024.  He has a history of chronic systolic heart failure secondary to nonischemic cardiomyopathy, hypertension, hyperlipidemia, stroke, alcohol abuse in remission and CKD 3. The patient's ICD was implanted May 28, 2020.  Presents for ICD lead extraction and replacement.     Objective Past medical, surgical, social and family history were reviewed.   ROS:   Please see the history of present illness.    All other systems reviewed and are negative.   EKGs/Labs/Other Studies Reviewed:     The following studies were reviewed today:   April 27, 2024 in-clinic device interrogation personally reviewed SIC counter 163 over the past 2 weeks. Lead parameter stable.  No noise sensed by the device with isometrics today. Lead noise episodes reviewed.  Nonphysiologic noise detected by the device as HVR and appropriately       Physical Exam:     VS:  BP 181/92 (BP Location: Left Arm, Patient Position: Sitting, Cuff Size: Normal)   Pulse 73   Ht 5' 10 (1.778 m)   Wt 171 lb (77.6 kg)   SpO2 96%   BMI 24.54 kg/m         Wt Readings from Last 3 Encounters:  04/27/24 171 lb (77.6 kg)  04/16/24 173 lb (78.5 kg)  08/24/23 172 lb 9.6 oz (78.3 kg)      GEN: no distress CARD: RRR, No MRG.  CIED pocket well-healed RESP: No IWOB. CTAB.     Assessment ASSESSMENT:     1. Chronic systolic (congestive) heart failure (HCC)   2. ICD (implantable cardioverter-defibrillator) in place   3. Malfunction of  implantable defibrillator ventricular (ICD) lead     PLAN:     In order of problems listed above:   #Chronic sick Solik heart failure #Nonischemic cardiomyopathy #ICD in situ #RV lead malfunction The patient has an ICD with evidence of RV lead fracture.  There is noise on the lead and the SIC counter is elevated. I have discussed the implications of such as a lead abnormality.  We discussed the potential that his device would not be able to deliver a successful shock in the event of an arrhythmia.  I discussed lead abandonment/addition of a new lead and lead extraction and replacement.  Given his young age, favor lead extraction and replacement.   I discussed the procedure in detail including the risks, recovery and likelihood of success.  He would like to proceed with scheduling.   Risks, benefits, alternatives to ICD lead extraction and replacement were discussed in detail with the patient today. The patient understands that the risks include but are not limited to bleeding, infection, pneumothorax, perforation, tamponade, severe bleeding requiring emergent open heart surgery, vascular damage, renal failure, MI, stroke, death, and lead dislodgement and wishes to proceed.  We will therefore schedule device implantation at the next available time.   Presents for ICD lead extraction and  replacement.     Signed, Ole Holts, MD, [facility], [facility] 06/07/2024 Electrophysiology  [facility]

## 2024-06-08 ENCOUNTER — Ambulatory Visit (HOSPITAL_COMMUNITY)

## 2024-06-08 DIAGNOSIS — T82110A Breakdown (mechanical) of cardiac electrode, initial encounter: Secondary | ICD-10-CM | POA: Diagnosis not present

## 2024-06-08 MED ORDER — ACETAMINOPHEN 325 MG PO TABS: 325.0000 mg | ORAL_TABLET | ORAL | Status: AC | PRN

## 2024-06-08 MED FILL — Fentanyl Citrate Preservative Free (PF) Inj 100 MCG/2ML: INTRAMUSCULAR | Qty: 1 | Status: AC

## 2024-06-08 MED FILL — Lidocaine HCl Local Preservative Free (PF) Inj 1%: INTRAMUSCULAR | Qty: 30 | Status: AC

## 2024-06-08 NOTE — Discharge Instructions (Signed)
 After Your Lead Revision  Do not lift your arm above shoulder height for 1 week after your procedure. After 7 days, you may progress as below.  You should remove your sling 24 hours after your procedure, unless otherwise instructed by your provider.     Friday June 15, 2024  Saturday June 16, 2024 Sunday June 17, 2024 Monday June 18, 2024   Do not lift, push, pull, or carry anything over 10 pounds with the affected arm until 6 weeks (Friday July 20, 2024) after your procedure.   Do not drive until your wound check or until instructed by your healthcare provider that you are safe to do so.   Monitor your surgical site for redness, swelling, and drainage. Call the device clinic at [phone] if you experience these symptoms or fever/chills.  If your incision is sealed with Steri-strips or staples. You may shower 7 days after your procedure and wash your incision with soap and water as long as it is healed. If your incision is closed with Dermabond/Surgical glue. You may shower 1 day after your pacemaker implant and wash your incision with soap and water. Avoid lotions, ointments, or perfumes over your incision until it is well-healed.  You may use a hot tub or a pool AFTER your wound check appointment if the incision is completely closed.   Your cardiac device may be MRI compatible. We will discuss this at your office visit/Wound check  Remote monitoring is used to monitor your cardiac device from home. This monitoring is scheduled every 91 days by our office. It allows us  to keep an eye on the functioning of your device to ensure it is working properly. You will routinely see your Electrophysiologist annually (more often if necessary).   Implantable Cardiac Device Lead Replacement, Care After This sheet gives you information about how to care for yourself after your procedure. Your health care provider may also give you more specific instructions. If you have problems or questions,  contact your health care provider. What can I expect after the procedure? After your procedure, it is common to have: Mild discomfort at the incision site. A small amount of drainage or bleeding at the incision site. This is usually no more than a spot. Follow these instructions at home: Incision care        Follow instructions from your health care provider about how to take care of your incision. Make sure you: Leave stitches (sutures), skin glue, or adhesive strips in place. These skin closures may need to stay in place for 2 weeks or longer. If adhesive strip edges start to loosen and curl up, you may trim the loose edges. Do not remove adhesive strips completely unless your health care provider tells you to do that. Check your incision area every day for signs of infection. Check for: More redness, swelling, or pain. More fluid or blood. Warmth. Pus or a bad smell. Electric and Engineer, building services cell phones should be kept 12 inches (30 cm) away from the cardiac device when they are on. When talking on a cell phone, use the ear on the opposite side of your cardiac device. Do not place a cell phone in a pocket next to the cardiac device. Household appliances do not interfere with modern-day cardiac device. Medicines Take over-the-counter and prescription medicines only as told by your health care provider. General instructions Do not raise the arm on the side of your procedure higher than your shoulder for at least 7 days. Except  for this restriction, continue to use your arm as normal to prevent problems. Do not take baths, swim, or use a hot tub until your health care provider says it is okay to do so. You may shower as directed by your health care provider. Do not lift anything that is heavier than 10 lb (4.5 kg) for 6 weeks or the limit that your health care provider tells you, until he or she says that it is safe. Return to your normal activities after 2 weeks, or as told by  your health care provider. Ask your health care provider what activities are safe for you. Keep all follow-up visits as told by your health care provider. This is important. Contact a health care provider if: You have more redness, swelling, or pain around your incision. You have more fluid or blood coming from your incision. Your incision feels warm to the touch. You have pus or a bad smell coming from your incision. You have a fever. The arm or hand on the side of the cardiac device becomes swollen. The symptoms you had before your procedure are not getting better. Get help right away if: You develop chest pain. You feel like you will faint. You feel light-headed. You faint. Summary Check your incision area every day for signs of infection, such as more fluid or blood. A small amount of drainage or bleeding at the incision site is normal. Do not raise the arm on the side of your procedure higher than your shoulder for at least 5 days, or as long as directed by your health care provider. Digital cell phones should be kept 12 inches (30 cm) away from the cardiac device when they are on. When talking on a cell phone, use the ear on the opposite side of your cardiac device. If the symptoms that led to having your lead replaced are not getting better, contact your health care provider. This information is not intended to replace advice given to you by your health care provider. Make sure you discuss any questions you have with your health care provider.

## 2024-06-08 NOTE — Progress Notes (Signed)
 Discharge instructions reviewed with pt.  Copy of instructions given to pt. No new scripts. Pt to take tylenol  prn for pain/soreness at incision site Pt will be d/c'd via wheelchair with belongings and will be escorted by hospital volunteer.   [place],RN SWOT

## 2024-06-08 NOTE — Discharge Summary (Signed)
 ELECTROPHYSIOLOGY PROCEDURE DISCHARGE SUMMARY    Patient ID: Kenneth Pham,  MRN: [id number], DOB/AGE: [birth year] 68 y.o.  Admit date: 06/07/2024 Discharge date: 06/08/2024  Primary Care Physician: Ofilia Lamar CROME, MD  Primary Cardiologist: Redell Leiter, MD  Electrophysiologist: Dr. Inocencio    Primary Diagnosis:  ICD RV lead fracture  Secondary Diagnosis: Chronic systolic CHF NICM  No Known Allergies   Procedures This Admission:  Extraction of a RV lead fracture found to have noise and likely fracture 2.  Implantation of a Medtronic single chamber ICD on 06/08/2024 by Dr. Cindie.  The patient received a Medtronic Visia AF MRI [id number]  with Medtronic Sprint Quattro Secure [id number] right ventricular lead. DFTs were deferred at time of implant There were no post procedure complications 3.  CXR on 06/08/24 demonstrated no pneumothorax status post device implantation.      Brief HPI: Kenneth Pham is a 68 y.o. male was followed by EP in the outpatient setting. RV lead began to show signs of fracture.  Past medical history includes above. Risks, benefits, and alternatives to ICD lead extraction were reviewed with the patient who wished to proceed.   Hospital Course:  The patient was admitted and pt underwent extraction as above.  The pt then underwent implantation of a Medtronic single chamber ICD with details as outlined above.  They were monitored on telemetry overnight which demonstrated NSR .  Left chest was without hematoma or ecchymosis.  The device was interrogated and found to be functioning normally.  CXR was obtained and demonstrated no pneumothorax status post device implantation..  Wound care, arm mobility, and restrictions were reviewed with the patient.  The patient was examined and considered stable for discharge to home.   The patient's discharge medications include an ACE-I/ARB/ARNI (Entresto ) and beta blocker (Coreg ).   Anticoagulation resumption This patient is  not on anticoagulation.  Physical Exam: Vitals:   06/07/24 2003 06/08/24 0150 06/08/24 0529 06/08/24 0715  BP: 116/79 126/84 136/83 139/77  Pulse: 87 76 73 73  Resp: 20 20 20 18   Temp: 98.6 F (37 C) 98.4 F (36.9 C) 97.6 F (36.4 C) 97.9 F (36.6 C)  TempSrc: Oral Oral Oral Oral  SpO2: 95% 96% 98% 97%  Weight:   76.9 kg   Height:        GEN- NAD. A&O x 3.  HEENT: Normocephalic, atraumatic Lungs- CTAB, normal effort.  Heart- RRR. No M/G/R.  GI- Soft, NT, ND.  Extremities- No clubbing, cyanosis, or edema Skin- Warm and dry, no rash or lesion. ICD site stable  Discharge Medications:  Allergies as of 06/08/2024   No Known Allergies      Medication List     PAUSE taking these medications    aspirin EC 81 MG tablet Wait to take this until: June 11, 2024 Take 81 mg by mouth daily.       TAKE these medications    acetaminophen  325 MG tablet Commonly known as: TYLENOL  Take 1-2 tablets (325-650 mg total) by mouth every 4 (four) hours as needed for mild pain (pain score 1-3).   albuterol  108 (90 Base) MCG/ACT inhaler Commonly known as: VENTOLIN  HFA Inhale 2 puffs into the lungs every 6 (six) hours as needed for wheezing or shortness of breath. TAKE 2 PUFFS BY MOUTH EVERY 6 HOURS AS NEEDED FOR WHEEZE OR SHORTNESS OF BREATH   atorvastatin  80 MG tablet Commonly known as: LIPITOR TAKE 1 TABLET BY MOUTH EVERY DAY   carvedilol  25 MG tablet  Commonly known as: COREG  TAKE 1 TABLET (25 MG TOTAL) BY MOUTH 2 (TWO) TIMES DAILY. DOSE INCREASE   dapagliflozin  propanediol 10 MG Tabs tablet Commonly known as: Farxiga  Take 1 tablet (10 mg total) by mouth daily. TAKE 1 TABLET BY MOUTH EVERY DAY BEFORE BREAKFAST   Entresto  97-103 MG Generic drug: sacubitril -valsartan  TAKE 1 TABLET BY MOUTH TWICE A DAY   furosemide  40 MG tablet Commonly known as: LASIX  TAKE 1 TABLET (40 MG TOTAL) BY MOUTH DAILY AS NEEDED (FOR WEIGHT GAIN 3 POUNDS ABOVE BASELINE).   Lokelma  10 g Pack  packet Generic drug: sodium zirconium cyclosilicate  Take 10 g by mouth 2 (two) times a week.        Disposition: Home with usual follow up as in AVS  Duration of Discharge Encounter:  APP time: 24 minutes  Signed, Ozell Prentice Passey, PA-C  06/08/2024 7:18 AM

## 2024-06-08 NOTE — Plan of Care (Signed)
  Problem: Clinical Measurements: Goal: Ability to maintain clinical measurements within normal limits will improve Outcome: Progressing Goal: Will remain free from infection Outcome: Progressing Goal: Cardiovascular complication will be avoided Outcome: Progressing   

## 2024-06-08 NOTE — Anesthesia Postprocedure Evaluation (Signed)
 Anesthesia Post Note  Patient: Kenneth Pham  Procedure(Pham) Performed: LEAD EXTRACTION LEAD INSERTION ICD GENERATOR CHANGEOUT     Patient location during evaluation: Cath Lab Anesthesia Type: General Level of consciousness: awake and alert Pain management: pain level controlled Vital Signs Assessment: post-procedure vital signs reviewed and stable Respiratory status: spontaneous breathing, nonlabored ventilation, respiratory function stable and patient connected to nasal cannula oxygen Cardiovascular status: blood pressure returned to baseline and stable Postop Assessment: no apparent nausea or vomiting Anesthetic complications: no   No notable events documented.  Last Vitals:  Vitals:   06/08/24 0529 06/08/24 0715  BP: 136/83 139/77  Pulse: 73 73  Resp: 20 18  Temp: 36.4 C 36.6 C  SpO2: 98% 97%    Last Pain:  Vitals:   06/08/24 0715  TempSrc: Oral  PainSc:                  Kenneth Pham

## 2024-06-10 LAB — TYPE AND SCREEN
ABO/RH(D): A POS
Antibody Screen: NEGATIVE
Unit division: 0
Unit division: 0
Unit division: 0
Unit division: 0

## 2024-06-10 LAB — BPAM RBC
Blood Product Expiration Date: 202508112359
Blood Product Expiration Date: 202508112359
Blood Product Expiration Date: 202508112359
Blood Product Expiration Date: 202508112359
ISSUE DATE / TIME: 202507151402
ISSUE DATE / TIME: 202507151402
ISSUE DATE / TIME: 202507241429
ISSUE DATE / TIME: 202507241429
Unit Type and Rh: 6200
Unit Type and Rh: 6200
Unit Type and Rh: 6200
Unit Type and Rh: 6200

## 2024-06-19 ENCOUNTER — Ambulatory Visit: Payer: Self-pay

## 2024-06-19 NOTE — Progress Notes (Signed)
 Please inform him results from the ultrasound of the neck vessels showed less than 60% narrowing on both sides.  This is no significant change in comparison to prior results from July 2020. Overall reassuring results.

## 2024-06-19 NOTE — Telephone Encounter (Signed)
 Left VM to return call

## 2024-06-19 NOTE — Telephone Encounter (Signed)
-----   Message from Tipton R Madireddy sent at 06/19/2024 12:54 PM EDT ----- Please inform him results from the ultrasound of the neck vessels showed less than 60% narrowing on both sides.  This is no significant change in comparison to prior results from July 2020. Overall reassuring results. ----- Message ----- From: Interface, Three One Seven Sent: 05/25/2024   9:20 AM EDT To: Alean JONELLE Kobus, MD

## 2024-06-21 ENCOUNTER — Ambulatory Visit: Attending: Cardiology

## 2024-06-21 ENCOUNTER — Ambulatory Visit: Payer: Self-pay | Admitting: Cardiology

## 2024-06-21 DIAGNOSIS — I428 Other cardiomyopathies: Secondary | ICD-10-CM | POA: Diagnosis not present

## 2024-06-21 LAB — CUP PACEART INCLINIC DEVICE CHECK
Date Time Interrogation Session: 20250807112323
Implantable Lead Connection Status: 753985
Implantable Lead Implant Date: 20250725
Implantable Lead Location: 753860
Implantable Pulse Generator Implant Date: 20210714

## 2024-06-21 NOTE — Progress Notes (Signed)
 Normal single chamber ICD wound check. Wound well healed. Presenting rhythm: VS 68 . Routine testing performed. Thresholds, sensing, and impedances consistent with implant measurements. No treated arrhythmias. Optivol currently abnormal. Patient advised to weigh daily and take Lasix  PRN as prescribed. Reviewed arm restrictions to continue for 6 weeks total post op. Reviewed shock plan.  Pt enrolled in remote follow-up.

## 2024-06-21 NOTE — Patient Instructions (Signed)

## 2024-07-09 ENCOUNTER — Ambulatory Visit: Attending: Cardiology

## 2024-07-09 DIAGNOSIS — I5022 Chronic systolic (congestive) heart failure: Secondary | ICD-10-CM | POA: Diagnosis not present

## 2024-07-09 DIAGNOSIS — Z95810 Presence of automatic (implantable) cardiac defibrillator: Secondary | ICD-10-CM

## 2024-07-10 NOTE — Progress Notes (Signed)
 EPIC Encounter for ICM Monitoring  [name] is a 68 y.o. male Date: 07/10/2024 Primary Care Physican: Ofilia Lamar CROME, MD Primary Cardiologist: Madireddy                                           Electrophysiologist: Inocencio 01/26/2024 Weight: 169-170 lbs      05/08/2024 Weight: 170 lbs    07/10/2024 Weight:                                                 Spoke with patient and heart failure questions reviewed.  Transmission results reviewed.  Pt reports a minimal swelling in his feet and SOB but overall feeling good.  He did take PRN lasix  on 8/25.  He does not feel like he has a lot of fluid.  Minimal sx do not correlate with Optivol reading.   He had lead extraction 7/24 with new lead insertion.  Discussed Optivol with Delon Sharps, RN Device clinic and nurse and decision is to reset Optivol since lead replacement to provide more accurate reading.     Optivol thoracic impedance suggesting possible fluid accumulation starting 7/24.   Prescribed:  Furosemide  40 mg take 1 tablet(s) (40 mg total) by mouth as needed (take only when weight goes up 3 lbs from baseline).   Labs: 05/17/2024 Creatinine 1.18, BUN 14, Potassium 4.4, Sodium 144, GFR 68 04/16/2024 Creatinine 1.34, BUN 16, Potassium 4.7, Sodium 143, GFR 58 A complete set of results can be found in Results Review.   Recommendations:  Pt will take PRN Lasix  for minimal fluid symptoms until they resolve.  Explained that fluid level reading may be inaccurate due to lead replacement.  Advised Randall Sharps, RN will be in [place] office 9/8 and will reset the Optivol and he agreed to office appt.       Follow-up plan: ICM clinic phone appointment on 07/19/2024 to recheck fluid levels and to remind of office visit with Randall Sharps at [facility] office 9/8.   91 day device clinic remote transmission 08/23/2024.     EP/Cardiology Office Visits:  09/13/2024 with Redell Leiter.   09/13/2024 with Dr Cindie.   Copy of ICM check sent to  Dr. Inocencio.    3 month ICM trend: 07/09/2024.    12-14 Month ICM trend:     [name], RN 07/10/2024 7:43 AM

## 2024-07-11 NOTE — Telephone Encounter (Signed)
 Returned call to patient as requested by voice mail message.  He wanted to inform he fell last week but did not have any injuries.  He asked if this could have made a difference in his fluid levels readings and advised no and explained the fluid levels readings may be inaccurate due to the lead replacement.  He appreciate the call back anf feeling well today.

## 2024-07-19 ENCOUNTER — Ambulatory Visit: Attending: Cardiology

## 2024-07-19 DIAGNOSIS — I5022 Chronic systolic (congestive) heart failure: Secondary | ICD-10-CM

## 2024-07-19 DIAGNOSIS — Z95810 Presence of automatic (implantable) cardiac defibrillator: Secondary | ICD-10-CM

## 2024-07-19 NOTE — Progress Notes (Signed)
 EPIC Encounter for ICM Monitoring  [name] is a 68 y.o. male Date: 07/19/2024 Primary Care Physican: Ofilia Lamar CROME, MD Primary Cardiologist: Madireddy                                           Electrophysiologist: Inocencio 01/26/2024 Weight: 169-170 lbs      05/08/2024 Weight: 170 lbs    07/19/2024 Weight:   168-169 lbs                                              Spoke with patient and heart failure questions reviewed.  Transmission results reviewed.  Pt asymptomatic for fluid accumulation.   Reminded patient to meet with Kenneth Pham, Device Clinic RN at [facility] office 9/8 to reset Optivol.     Optivol thoracic impedance suggesting possible fluid accumulation starting 7/24.  Optivol will be reset 9/8.     Prescribed:  Furosemide  40 mg take 1 tablet(s) (40 mg total) by mouth as needed (take only when weight goes up 3 lbs from baseline).   Labs: 05/17/2024 Creatinine 1.18, BUN 14, Potassium 4.4, Sodium 144, GFR 68 04/16/2024 Creatinine 1.34, BUN 16, Potassium 4.7, Sodium 143, GFR 58 A complete set of results can be found in Results Review.   Recommendations:  No changes and encouraged to call if experiencing any fluid symptoms.    Follow-up plan: ICM clinic phone appointment on 08/16/2024.   91 day device clinic remote transmission 08/23/2024.     EP/Cardiology Office Visits:  09/13/2024 with Redell Leiter.   09/13/2024 with Dr Cindie.   Copy of ICM check sent to Dr. Inocencio.    3 month ICM trend: 07/19/2024.    12-14 Month ICM trend:     [name], RN 07/19/2024 11:20 AM

## 2024-07-31 LAB — ECHO INTRAOPERATIVE TEE
Height: 70 in
Weight: 2720 [oz_av]

## 2024-08-16 ENCOUNTER — Ambulatory Visit: Attending: Cardiology

## 2024-08-16 ENCOUNTER — Telehealth: Payer: Self-pay

## 2024-08-16 DIAGNOSIS — I5022 Chronic systolic (congestive) heart failure: Secondary | ICD-10-CM | POA: Diagnosis not present

## 2024-08-16 DIAGNOSIS — Z95810 Presence of automatic (implantable) cardiac defibrillator: Secondary | ICD-10-CM

## 2024-08-16 NOTE — Progress Notes (Signed)
 EPIC Encounter for ICM Monitoring  [name] is a 68 y.o. male Date: 08/16/2024 Primary Care Physican: Ofilia Lamar CROME, MD Primary Cardiologist: [facility]                                       Electrophysiologist: Inocencio 01/26/2024 Weight: 169-170 lbs      05/08/2024 Weight: 170 lbs    07/19/2024 Weight:   168-169 lbs                                              Attempted call to patient and unable to reach.    Transmission results reviewed.      Since 07/19/2024 ICM Remote Transmission:  Optivol thoracic impedance suggesting normal fluid levels since Optivol was calibrated on 07/23/2024.  Optivol reset 07/23/2024.     Prescribed:  Furosemide  40 mg take 1 tablet(s) (40 mg total) by mouth as needed (take only when weight goes up 3 lbs from baseline).   Labs: 05/17/2024 Creatinine 1.18, BUN 14, Potassium 4.4, Sodium 144, GFR 68 04/16/2024 Creatinine 1.34, BUN 16, Potassium 4.7, Sodium 143, GFR 58 A complete set of results can be found in Results Review.   Recommendations:  Unable to reach.     Follow-up plan: ICM clinic phone appointment on 09/24/2024.   91 day device clinic remote transmission 08/23/2024.     EP/Cardiology Office Visits:  09/13/2024 with Redell Leiter.   09/13/2024 with Dr Cindie.   Copy of ICM check sent to Dr. Inocencio.    Remote monitoring is medically necessary for Heart Failure Management.    90 day Daily Thoracic Impedance ICM trend: 05/17/2024 through 08/17/2024.    12-14 Month Thoracic Impedance ICM trend:     [name], RN 08/16/2024 7:43 AM

## 2024-08-16 NOTE — Telephone Encounter (Signed)
 Remote ICM transmission received.  Attempted call to patient regarding ICM remote transmission and no answer.

## 2024-08-23 ENCOUNTER — Ambulatory Visit: Payer: Self-pay

## 2024-08-23 DIAGNOSIS — I5022 Chronic systolic (congestive) heart failure: Secondary | ICD-10-CM | POA: Diagnosis not present

## 2024-08-23 LAB — CUP PACEART REMOTE DEVICE CHECK
Battery Remaining Longevity: 101 mo
Battery Voltage: 3.02 V
Brady Statistic RV Percent Paced: 0.04 %
Date Time Interrogation Session: 20251009043722
HighPow Impedance: 66 Ohm
Implantable Lead Connection Status: 753985
Implantable Lead Implant Date: 20250725
Implantable Lead Location: 753860
Implantable Pulse Generator Implant Date: 20210714
Lead Channel Impedance Value: 380 Ohm
Lead Channel Impedance Value: 456 Ohm
Lead Channel Pacing Threshold Amplitude: 0.75 V
Lead Channel Pacing Threshold Pulse Width: 0.4 ms
Lead Channel Sensing Intrinsic Amplitude: 16.25 mV
Lead Channel Sensing Intrinsic Amplitude: 16.25 mV
Lead Channel Setting Pacing Amplitude: 2 V
Lead Channel Setting Pacing Pulse Width: 0.4 ms
Lead Channel Setting Sensing Sensitivity: 0.3 mV
Zone Setting Status: 755011
Zone Setting Status: 755011

## 2024-08-23 NOTE — Progress Notes (Signed)
 Remote ICD Transmission

## 2024-08-24 ENCOUNTER — Ambulatory Visit: Payer: Self-pay | Admitting: Cardiology

## 2024-08-28 NOTE — Progress Notes (Signed)
 Remote ICD Transmission

## 2024-09-11 DIAGNOSIS — Z95810 Presence of automatic (implantable) cardiac defibrillator: Secondary | ICD-10-CM | POA: Insufficient documentation

## 2024-09-12 NOTE — Progress Notes (Unsigned)
  Electrophysiology Office Follow up Visit Note:    Date:  09/13/2024   ID:  Kenneth Pham, DOB 1956-09-16, MRN [id number]  PCP:  Ofilia Lamar CROME, MD  [facility] Cardiologist:  Redell Leiter, MD  [facility] Electrophysiologist:  Will Gladis Norton, MD    Interval History:     Kenneth Pham is a 68 y.o. male who presents for a follow up visit.   On June 07, 2024 the patient underwent extraction of an ICD lead because of a lead fracture.  An ICD lead was reimplanted.  Remote monitoring following the procedure has demonstrated stable device function.  He is doing well today.  Feels well.  His incision has healed well.       Past medical, surgical, social and family history were reviewed.  ROS:   Please see the history of present illness.    All other systems reviewed and are negative.  EKGs/Labs/Other Studies Reviewed:    The following studies were reviewed today:  September 13, 2024 in-clinic device interrogation personally reviewed Battery and lead parameter stable.        Physical Exam:    VS:  BP (!) 160/82 (BP Location: Left Arm, Patient Position: Sitting, Cuff Size: Normal)   Pulse 72   Ht 5' 10 (1.778 m)   Wt 173 lb (78.5 kg)   SpO2 97%   BMI 24.82 kg/m     Wt Readings from Last 3 Encounters:  09/13/24 173 lb (78.5 kg)  09/13/24 173 lb (78.5 kg)  06/08/24 169 lb 8.5 oz (76.9 kg)     GEN: no distress CARD: RRR, No MRG.  Generator pocket well-healed.  Superficial collaterals visible. RESP: No IWOB. CTAB.      ASSESSMENT:    1. Chronic systolic (congestive) heart failure (HCC)   2. ICD (implantable cardioverter-defibrillator) in place    PLAN:    In order of problems listed above:  #Chronic systolic heart failure #ICD in situ Device functioning appropriately.  Continue remote monitoring.  NYHA class II.  Warm and dry on exam.  Continue Entresto , Lasix , Farxiga   I discussed my upcoming departure from [facility].  He will continue to  follow-up with pulm my partners, Dr. Norton.  #Hypertension Above goal today.  Recommend checking blood pressures 1-2 times per week at home and recording the values.  Recommend bringing these recordings to the primary care physician.  He saw Dr. Leiter earlier today who is working to optimize his blood pressure.   Follow-up 1 year with the EP MD.   Signed, Ole Holts, MD, [facility], [facility] 09/13/2024 3:09 PM    Electrophysiology [facility]

## 2024-09-12 NOTE — Progress Notes (Unsigned)
 Cardiology Office Note:    Date:  09/13/2024   ID:  Kenneth Pham, DOB June 27, 1956, MRN [id number]  PCP:  Ofilia Lamar CROME, MD  Cardiologist:  Redell Leiter, MD    Referring MD: Ofilia Lamar CROME, MD    ASSESSMENT:    1. Chronic systolic (congestive) heart failure (HCC)   2. ICD (implantable cardioverter-defibrillator) in place   3. Hypertensive heart disease with heart failure (HCC)    PLAN:    In order of problems listed above:  Kariem continues to do well from a cardiology perspective and because on good foundational medical therapy including SGLT2 inhibitor beta-blocker high dose Entresto  currently not on loop diuretic and is not an MRA with his hyper kalemia I asked him to check home blood pressures 2 hours in the morning after taking his medications to me list in 2 weeks.  If hypertension remains above target I will add a combination of oral Isordil and hydralazine Stable ICD followed in our device practice Check labs today lipids CMP proBNP   Next appointment: 6 months   Medication Adjustments/Labs and Tests Ordered: Current medicines are reviewed at length with the patient today.  Concerns regarding medicines are outlined above.  Orders Placed This Encounter  Procedures   Comp Met (CMET)   Pro b natriuretic peptide (BNP)   Lipid Profile   CBC   Meds ordered this encounter  Medications   atorvastatin  (LIPITOR ) 80 MG tablet    Sig: Take 1 tablet (80 mg total) by mouth daily.    Dispense:  90 tablet    Refill:  3   furosemide  (LASIX ) 40 MG tablet    Sig: Take 1 tablet (40 mg total) by mouth daily as needed for fluid or edema (for weight gain 3 pounds above baseline).    Dispense:  90 tablet    Refill:  3     History of Present Illness:    Kenneth Pham is a 68 y.o. male with a hx of chronic systolic heart failure with hypertensive heart disease most recent ejection fraction 30 to 35% stage II CKD hyperlipidemia ICD and hyperkalemia requiring resin binder therapy to  maintain on Entresto   last seen 08/24/2023.  Compliance with diet, lifestyle and medications: Yes  [name] is pleased with the quality of his life if he walks a further distance [place] carries walks uphill he is mildly short of breath not severe limiting nonprogressive [place]  Heart Association class II He has no edema orthopnea chest pain palpitation or syncope No ICD therapy and follows in device clinic Has coincident COPD and uses a bronchodilator Past Medical History:  Diagnosis Date   Acute CHF (HCC) 05/16/2019   AICD (automatic cardioverter/defibrillator) present    Alcohol dependence in remission (HCC) 05/16/2019   Arteriosclerosis of carotid artery, right 05/15/2019   Bilateral carotid artery stenosis 05/15/2019   Cerebellar stroke, acute (HCC) 05/09/2019   Chronic anticoagulation 07/13/2019   CKD (chronic kidney disease) stage 2, GFR 60-89 ml/min 05/29/2019   Dilated cardiomyopathy (HCC) 05/29/2019   Heart failure with reduced ejection fraction (HCC) 05/09/2019   History of stroke involving cerebellum 05/09/2019   Hypercholesteremia    Hypertension    Hypertensive heart disease with heart failure (HCC) 05/29/2019   Nonsustained ventricular tachycardia (HCC) 06/19/2020   Formatting of this note might be different from the original. 2021: ICD   Precordial chest pain 08/21/2019   Prediabetes 06/08/2019   Formatting of this note might be different from the original. [facility] 05/29/2019: A1C 5.8%  Pulmonary nodules 05/15/2019   05/01/2019 CT angio - 8.30mm bandlike nodular density lingula - 5.75mm R middle lobe - 61.5 pack year hx, rec 3 month f/u   Shortness of breath 08/21/2019   Snores 05/29/2019   Stage 3a chronic kidney disease (HCC) 05/29/2019   Viral respiratory infection 10/16/2021   Formatting of this note might be different from the original. 10/16/2021: COVID negative    Current Medications: Current Meds  Medication Sig   acetaminophen  (TYLENOL ) 325 MG tablet Take 1-2  tablets (325-650 mg total) by mouth every 4 (four) hours as needed for mild pain (pain score 1-3).   albuterol  (VENTOLIN  HFA) 108 (90 Base) MCG/ACT inhaler Inhale 2 puffs into the lungs every 6 (six) hours as needed for wheezing or shortness of breath. TAKE 2 PUFFS BY MOUTH EVERY 6 HOURS AS NEEDED FOR WHEEZE OR SHORTNESS OF BREATH   aspirin EC 81 MG tablet Take 81 mg by mouth daily.    carvedilol  (COREG ) 25 MG tablet TAKE 1 TABLET (25 MG TOTAL) BY MOUTH 2 (TWO) TIMES DAILY. DOSE INCREASE   dapagliflozin  propanediol (FARXIGA ) 10 MG TABS tablet Take 1 tablet (10 mg total) by mouth daily. TAKE 1 TABLET BY MOUTH EVERY DAY BEFORE BREAKFAST   ENTRESTO  97-103 MG TAKE 1 TABLET BY MOUTH TWICE A DAY   sodium zirconium cyclosilicate  (LOKELMA ) 10 g PACK packet Take 10 g by mouth 2 (two) times a week.   [DISCONTINUED] atorvastatin  (LIPITOR ) 80 MG tablet TAKE 1 TABLET BY MOUTH EVERY DAY   [DISCONTINUED] furosemide  (LASIX ) 40 MG tablet TAKE 1 TABLET (40 MG TOTAL) BY MOUTH DAILY AS NEEDED (FOR WEIGHT GAIN 3 POUNDS ABOVE BASELINE).      EKGs/Labs/Other Studies Reviewed:    The following studies were reviewed today:  Cardiac Studies & Procedures   ______________________________________________________________________________________________   STRESS TESTS  MYOCARDIAL PERFUSION IMAGING 11/28/2019  Interpretation Summary  Nuclear stress EF: 33%.  The left ventricular ejection fraction is moderately decreased (30-44%).   ECHOCARDIOGRAM  ECHOCARDIOGRAM COMPLETE 05/25/2024  Narrative ECHOCARDIOGRAM REPORT    Patient Name:   Kenneth Pham Date of Exam: 05/25/2024 Medical Rec #:  [id number]    Height:       70.0 in Accession #:    [id number]   Weight:       170.2 lb Date of Birth:  30-May-1956    BSA:          1.949 m Patient Age:    68 years     BP:           132/64 mmHg Patient Gender: M            HR:           66 bpm. Exam Location:  [facility]  Procedure: 2D Echo, Cardiac Doppler, Color Doppler  and Strain Analysis (Both Spectral and Color Flow Doppler were utilized during procedure).  Indications:    Chronic combined systolic and diastolic congestive heart failure (HCC) [I50.42 (ICD-10-CM)], Bilateral carotid artery stenosis [I65.23 (ICD-10-CM)]  History:        Patient has prior history of Echocardiogram examinations, most recent 08/01/2019. CHF and Cardiomyopathy, Carotid Disease and Stroke, Arrythmias:Tachycardia, Signs/Symptoms:Chest Pain and Shortness of Breath; Risk Factors:Hypertension.  Sonographer:    Saddie Chimes Referring Phys: [id number] SREEDHAR R MADIREDDY  IMPRESSIONS   1. Left ventricular ejection fraction, by estimation, is 30 to 35%. The left ventricle has moderately decreased function. The left ventricle has no regional wall motion abnormalities. There is mild left ventricular hypertrophy.  Left ventricular diastolic parameters are consistent with Grade I diastolic dysfunction (impaired relaxation). The average left ventricular global longitudinal strain is -16.5 %. The global longitudinal strain is abnormal. 2. Right ventricular systolic function is normal. The right ventricular size is normal. There is normal pulmonary artery systolic pressure. 3. The mitral valve is normal in structure. Mild to moderate mitral valve regurgitation. No evidence of mitral stenosis. 4. The aortic valve is normal in structure. Aortic valve regurgitation is not visualized. No aortic stenosis is present. 5. The inferior vena cava is normal in size with greater than 50% respiratory variability, suggesting right atrial pressure of 3 mmHg.  FINDINGS Left Ventricle: Left ventricular ejection fraction, by estimation, is 30 to 35%. The left ventricle has moderately decreased function. The left ventricle has no regional wall motion abnormalities. The average left ventricular global longitudinal strain is -16.5 %. Strain was performed and the global longitudinal strain is abnormal. The left  ventricular internal cavity size was normal in size. There is mild left ventricular hypertrophy. Left ventricular diastolic parameters are consistent with Grade I diastolic dysfunction (impaired relaxation).  Right Ventricle: The right ventricular size is normal. No increase in right ventricular wall thickness. Right ventricular systolic function is normal. There is normal pulmonary artery systolic pressure. The tricuspid regurgitant velocity is 2.81 m/s, and with an assumed right atrial pressure of 3 mmHg, the estimated right ventricular systolic pressure is 34.6 mmHg.  Left Atrium: Left atrial size was normal in size.  Right Atrium: Right atrial size was normal in size.  Pericardium: There is no evidence of pericardial effusion.  Mitral Valve: The mitral valve is normal in structure. Mild to moderate mitral valve regurgitation. No evidence of mitral valve stenosis. MV peak gradient, 6.2 mmHg. The mean mitral valve gradient is 3.0 mmHg.  Tricuspid Valve: The tricuspid valve is normal in structure. Tricuspid valve regurgitation is mild . No evidence of tricuspid stenosis.  Aortic Valve: The aortic valve is normal in structure. Aortic valve regurgitation is not visualized. No aortic stenosis is present. Aortic valve mean gradient measures 4.5 mmHg. Aortic valve peak gradient measures 7.7 mmHg. Aortic valve area, by VTI measures 2.44 cm.  Pulmonic Valve: The pulmonic valve was normal in structure. Pulmonic valve regurgitation is not visualized. No evidence of pulmonic stenosis.  Aorta: The aortic root is normal in size and structure.  Venous: The inferior vena cava is normal in size with greater than 50% respiratory variability, suggesting right atrial pressure of 3 mmHg.  IAS/Shunts: No atrial level shunt detected by color flow Doppler.   LEFT VENTRICLE PLAX 2D LVIDd:         5.10 cm      Diastology LVIDs:         4.10 cm      LV e' medial:    4.68 cm/s LV PW:         1.20 cm      LV  E/e' medial:  14.6 LV IVS:        1.20 cm      LV e' lateral:   7.40 cm/s LVOT diam:     2.50 cm      LV E/e' lateral: 9.2 LV SV:         92 LV SV Index:   47           2D Longitudinal Strain LVOT Area:     4.91 cm     2D Strain GLS Avg:     -16.5 %  LV Volumes (MOD) LV vol d, MOD A2C: 108.0 ml LV vol d, MOD A4C: 148.0 ml LV vol s, MOD A2C: 54.2 ml LV vol s, MOD A4C: 67.0 ml LV SV MOD A2C:     53.8 ml LV SV MOD A4C:     148.0 ml LV SV MOD BP:      63.4 ml  RIGHT VENTRICLE RV Basal diam:  3.40 cm RV Mid diam:    2.60 cm TAPSE (M-mode): 2.4 cm  LEFT ATRIUM           Index LA diam:      4.90 cm 2.51 cm/m LA Vol (A4C): 59.7 ml 30.63 ml/m AORTIC VALVE                     PULMONIC VALVE AV Area (Vmax):    2.38 cm      PV Vmax:       1.03 m/s AV Area (Vmean):   2.16 cm      PV Vmean:      61.700 cm/s AV Area (VTI):     2.44 cm      PV VTI:        0.249 m AV Vmax:           139.00 cm/s   PV Peak grad:  4.2 mmHg AV Vmean:          100.600 cm/s  PV Mean grad:  2.0 mmHg AV VTI:            0.374 m AV Peak Grad:      7.7 mmHg AV Mean Grad:      4.5 mmHg LVOT Vmax:         67.50 cm/s LVOT Vmean:        44.250 cm/s LVOT VTI:          0.186 m LVOT/AV VTI ratio: 0.50  AORTA Ao Asc diam: 3.50 cm  MITRAL VALVE                TRICUSPID VALVE MV Area (PHT): 2.23 cm     TR Peak grad:   31.6 mmHg MV Area VTI:   1.32 cm     TR Vmax:        281.00 cm/s MV Peak grad:  6.2 mmHg MV Mean grad:  3.0 mmHg     SHUNTS MV Vmax:       1.25 m/s     Systemic VTI:  0.19 m MV Vmean:      76.3 cm/s    Systemic Diam: 2.50 cm MV Decel Time: 340 msec MR Peak grad: 122.8 mmHg MR Vmax:      554.00 cm/s MV E velocity: 68.10 cm/s MV A velocity: 121.00 cm/s MV E/A ratio:  0.56  Lamar Fitch MD Electronically signed by Lamar Fitch MD Signature Date/Time: 05/25/2024/1:46:16 PM    Final   TEE  ECHO INTRAOPERATIVE TEE 06/07/2024  Narrative *INTRAOPERATIVE TRANSESOPHAGEAL REPORT  *    Patient Name:   CARA AGUINO Date of Exam: 06/07/2024 Medical Rec #:  [id number]    Height:       70.0 in Accession #:    [id number]   Weight:       170.0 lb Date of Birth:  04/09/1956    BSA:          1.95 m Patient Age:    68 years     BP:           145/77 mmHg  Patient Gender: M            HR:           76 bpm. Exam Location:  Inpatient  Transesophogeal exam was perform intraoperatively during surgical procedure. Patient was closely monitored under general anesthesia during the entirety of examination.  Indications:     Removal of pacemaker leads. Performing Phys: [id number] OLE ONEIDA HOLTS Diagnosing Phys: Juliene Clinton MD  Complications: No known complications during this procedure. POST-OP IMPRESSIONS Overall, there were no significant changes after leads were removed.  PRE-OP FINDINGS Left Ventricle: The left ventricle has severely reduced systolic function, with an ejection fraction of 20-30%. The cavity size was normal. There is no increase in left ventricular wall thickness. There is no left ventricular hypertrophy.   Right Ventricle: The right ventricle has normal systolic function. The cavity was normal. There is no increase in right ventricular wall thickness.  Left Atrium: Left atrial size was normal in size. No left atrial/left atrial appendage thrombus was detected.  Right Atrium: Right atrial size was normal in size.  Interatrial Septum: No atrial level shunt detected by color flow Doppler.  Pericardium: There is no evidence of pericardial effusion.  Mitral Valve: The mitral valve is normal in structure. Mitral valve regurgitation is mild by color flow Doppler. There is No evidence of mitral stenosis.  Tricuspid Valve: The tricuspid valve was normal in structure. Tricuspid valve regurgitation is trivial by color flow Doppler.  Aortic Valve: The aortic valve is tricuspid Aortic valve regurgitation was not visualized by color flow Doppler. There is no  stenosis of the aortic valve.   Pulmonic Valve: The pulmonic valve was normal in structure. Pulmonic valve regurgitation is not visualized by color flow Doppler.   Aorta: The aortic root, ascending aorta and aortic arch are normal in size and structure.   Juliene Clinton MD Electronically signed by Juliene Clinton MD Signature Date/Time: 07/31/2024/11:17:16 AM    Final  MONITORS  LONG TERM MONITOR (3-14 DAYS) 02/04/2020  Narrative A ZIO monitor was performed for 3 days and 23 hours beginning 02/04/2020 in order to assess arrhythmia associated with heart failure and CAD.  The rhythm throughout was sinus with minimum average and maximum heart rates of 51, 81 and 128 bpm.  There were no pauses of 3 seconds or greater and no episodes of second or third-degree AV nodal block or sinus node exit block.  Ventricular ectopy was occasional PVCs and rare couplets and triplets.  There were 11 runs of PVCs the fastest 12 complexes at a rate of 163 bpm and the longest 16.2 seconds at a relatively slow rate of 113 bpm.  Supraventricular ectopy was rare with no episodes of atrial fibrillation or flutter.  There was one symptomatic event with chest pain and palpitation associated with ventricular ectopy with PVCs and couplets.  There were 2 triggered events associated with ventricular and supraventricular ectopy with PVCs and APCs.   Conclusion, ventricular ectopy with occasional PVCs rare couplets and triplets and around 11 runs of PVCs present.       ______________________________________________________________________________________________          Recent Labs: 05/17/2024: ALT 10; BUN 14; Creatinine, Ser 1.18; Hemoglobin 11.8; Platelets 250; Potassium 4.4; Sodium 144  Recent Lipid Panel    Component Value Date/Time   CHOL 132 05/17/2024 0831   TRIG 97 05/17/2024 0831   HDL 38 (L) 05/17/2024 0831   CHOLHDL 3.5 05/17/2024 0831   LDLCALC 76 05/17/2024 0831    Physical  Exam:     VS:  BP (!) 152/72   Pulse 78   Ht 5' 10 (1.778 m)   Wt 173 lb (78.5 kg)   SpO2 96%   BMI 24.82 kg/m     Wt Readings from Last 3 Encounters:  09/13/24 173 lb (78.5 kg)  06/08/24 169 lb 8.5 oz (76.9 kg)  05/08/24 170 lb 3.2 oz (77.2 kg)     GEN:  Well nourished, well developed in no acute distress HEENT: Normal NECK: No JVD; No carotid bruits LYMPHATICS: No lymphadenopathy CARDIAC: RRR, no murmurs, rubs, gallops RESPIRATORY:  Clear to auscultation without rales, wheezing or rhonchi chest is hyperinflated ABDOMEN: Soft, non-tender, non-distended MUSCULOSKELETAL:  No edema; No deformity  SKIN: Warm and dry NEUROLOGIC:  Alert and oriented x 3 PSYCHIATRIC:  Normal affect    Signed, Redell Leiter, MD  09/13/2024 12:37 PM    [facility]

## 2024-09-13 ENCOUNTER — Other Ambulatory Visit: Payer: Self-pay | Admitting: Cardiology

## 2024-09-13 ENCOUNTER — Ambulatory Visit (INDEPENDENT_AMBULATORY_CARE_PROVIDER_SITE_OTHER): Admitting: Cardiology

## 2024-09-13 ENCOUNTER — Ambulatory Visit: Attending: Internal Medicine | Admitting: Cardiology

## 2024-09-13 ENCOUNTER — Encounter: Payer: Self-pay | Admitting: Cardiology

## 2024-09-13 VITALS — BP 160/82 | HR 72 | Ht 70.0 in | Wt 173.0 lb

## 2024-09-13 VITALS — BP 152/72 | HR 78 | Ht 70.0 in | Wt 173.0 lb

## 2024-09-13 DIAGNOSIS — Z95810 Presence of automatic (implantable) cardiac defibrillator: Secondary | ICD-10-CM

## 2024-09-13 DIAGNOSIS — I5022 Chronic systolic (congestive) heart failure: Secondary | ICD-10-CM | POA: Diagnosis not present

## 2024-09-13 DIAGNOSIS — I110 Hypertensive heart disease with heart failure: Secondary | ICD-10-CM | POA: Diagnosis not present

## 2024-09-13 LAB — CUP PACEART INCLINIC DEVICE CHECK
Date Time Interrogation Session: 20251030165900
HighPow Impedance: 82 Ohm
Implantable Lead Connection Status: 753985
Implantable Lead Implant Date: 20250725
Implantable Lead Location: 753860
Implantable Pulse Generator Implant Date: 20210714
Lead Channel Impedance Value: 513 Ohm
Lead Channel Pacing Threshold Amplitude: 0.5 V
Lead Channel Pacing Threshold Pulse Width: 0.4 ms
Lead Channel Sensing Intrinsic Amplitude: 20 mV

## 2024-09-13 MED ORDER — FUROSEMIDE 40 MG PO TABS: 40.0000 mg | ORAL_TABLET | Freq: Every day | ORAL | 3 refills | Status: AC | PRN

## 2024-09-13 MED ORDER — ATORVASTATIN CALCIUM 80 MG PO TABS: 80.0000 mg | ORAL_TABLET | Freq: Every day | ORAL | 3 refills | Status: AC

## 2024-09-13 NOTE — Patient Instructions (Signed)
 Medication Instructions:  Your physician recommends that you continue on your current medications as directed. Please refer to the Current Medication list given to you today.  *If you need a refill on your cardiac medications before your next appointment, please call your pharmacy*  Lab Work: Your physician recommends that you return for lab work in:   Labs today: CMP, Pro BNP, Lipids, CBC  If you have labs (blood work) drawn today and your tests are completely normal, you will receive your results only by: MyChart Message (if you have MyChart) OR A paper copy in the mail If you have any lab test that is abnormal or we need to change your treatment, we will call you to review the results.  Testing/Procedures: None  Follow-Up: At [facility], you and your health needs are our priority.  As part of our continuing mission to provide you with exceptional heart care, our providers are all part of one team.  This team includes your primary Cardiologist (physician) and Advanced Practice Providers or APPs (Physician Assistants and Nurse Practitioners) who all work together to provide you with the care you need, when you need it.  Your next appointment:   6 month(s)  Provider:   Redell Leiter, MD    We recommend signing up for the patient portal called MyChart.  Sign up information is provided on this After Visit Summary.  MyChart is used to connect with patients for Virtual Visits (Telemedicine).  Patients are able to view lab/test results, encounter notes, upcoming appointments, etc.  Non-urgent messages can be sent to your provider as well.   To learn more about what you can do with MyChart, go to [email or web address].   Other Instructions Check and record home blood pressures 2 hours after morning medication. Send Dr.Munley a BP log for 2 weeks and send by MyChart or mail.                      Dr. Leiter [address] [place], [place] [address]  Blood Pressure Record Sheet To  take your blood pressure, you will need a blood pressure machine. You can buy a blood pressure machine (blood pressure monitor) at your clinic, drug store, or online. When choosing one, consider: An automatic monitor that has an arm cuff. A cuff that wraps snugly around your upper arm. You should be able to fit only one finger between your arm and the cuff. A device that stores blood pressure reading results. Do not choose a monitor that measures your blood pressure from your wrist or finger. Follow your health care provider's instructions for how to take your blood pressure. To use this form: Get one reading in the morning (a.m.) 1-2 hours after you take any medicines. Get one reading in the evening (p.m.) before supper.   Blood pressure log Date: _______________________  a.m. _____________________(1st reading) HR___________            p.m. _____________________(2nd reading) HR__________  Date: _______________________  a.m. _____________________(1st reading) HR___________            p.m. _____________________(2nd reading) HR__________  Date: _______________________  a.m. _____________________(1st reading) HR___________            p.m. _____________________(2nd reading) HR__________  Date: _______________________  a.m. _____________________(1st reading) HR___________            p.m. _____________________(2nd reading) HR__________  Date: _______________________  a.m. _____________________(1st reading) HR___________            p.m. _____________________(2nd reading)  HR__________  Date: _______________________  a.m. _____________________(1st reading) HR___________            p.m. _____________________(2nd reading) HR__________  Date: _______________________  a.m. _____________________(1st reading) HR___________            p.m. _____________________(2nd reading) HR__________   This information is not intended to replace advice given to you by your health care  provider. Make sure you discuss any questions you have with your health care provider. Document Revised: 02/20/2020 Document Reviewed: 02/20/2020 Elsevier Patient Education  2021 [employer].

## 2024-09-13 NOTE — Patient Instructions (Signed)
 Medication Instructions:  Your physician recommends that you continue on your current medications as directed. Please refer to the Current Medication list given to you today.  *If you need a refill on your cardiac medications before your next appointment, please call your pharmacy*  Follow-Up: At [facility], you and your health needs are our priority.  As part of our continuing mission to provide you with exceptional heart care, our providers are all part of one team.  This team includes your primary Cardiologist (physician) and Advanced Practice Providers or APPs (Physician Assistants and Nurse Practitioners) who all work together to provide you with the care you need, when you need it.  Your next appointment:   1 year  Provider:   Soyla Norton, MD

## 2024-09-14 ENCOUNTER — Ambulatory Visit: Payer: Self-pay | Admitting: Cardiology

## 2024-09-14 LAB — CBC
Hematocrit: 40.5 % (ref 37.5–51.0)
Hemoglobin: 13.1 g/dL (ref 13.0–17.7)
MCH: 31.1 pg (ref 26.6–33.0)
MCHC: 32.3 g/dL (ref 31.5–35.7)
MCV: 96 fL (ref 79–97)
Platelets: 246 x10E3/uL (ref 150–450)
RBC: 4.21 x10E6/uL (ref 4.14–5.80)
RDW: 13.7 % (ref 11.6–15.4)
WBC: 8 x10E3/uL (ref 3.4–10.8)

## 2024-09-14 LAB — COMPREHENSIVE METABOLIC PANEL WITH GFR
ALT: 14 IU/L (ref 0–44)
AST: 14 IU/L (ref 0–40)
Albumin: 4.4 g/dL (ref 3.9–4.9)
Alkaline Phosphatase: 92 IU/L (ref 47–123)
BUN/Creatinine Ratio: 13 (ref 10–24)
BUN: 18 mg/dL (ref 8–27)
Bilirubin Total: 0.3 mg/dL (ref 0.0–1.2)
CO2: 25 mmol/L (ref 20–29)
Calcium: 9.5 mg/dL (ref 8.6–10.2)
Chloride: 104 mmol/L (ref 96–106)
Creatinine, Ser: 1.41 mg/dL — ABNORMAL HIGH (ref 0.76–1.27)
Globulin, Total: 2.3 g/dL (ref 1.5–4.5)
Glucose: 94 mg/dL (ref 70–99)
Potassium: 4.8 mmol/L (ref 3.5–5.2)
Sodium: 145 mmol/L — ABNORMAL HIGH (ref 134–144)
Total Protein: 6.7 g/dL (ref 6.0–8.5)
eGFR: 55 mL/min/1.73 — ABNORMAL LOW (ref >59)

## 2024-09-14 LAB — LIPID PANEL
Chol/HDL Ratio: 4 ratio (ref 0.0–5.0)
Cholesterol, Total: 162 mg/dL (ref 100–199)
HDL: 41 mg/dL (ref >39)
LDL Chol Calc (NIH): 99 mg/dL (ref 0–99)
Triglycerides: 123 mg/dL (ref 0–149)
VLDL Cholesterol Cal: 22 mg/dL (ref 5–40)

## 2024-09-14 LAB — PRO B NATRIURETIC PEPTIDE: NT-Pro BNP: 1339 pg/mL — ABNORMAL HIGH (ref 0–376)

## 2024-09-17 ENCOUNTER — Ambulatory Visit: Payer: Self-pay

## 2024-09-24 ENCOUNTER — Ambulatory Visit: Attending: Cardiology

## 2024-09-24 DIAGNOSIS — Z95810 Presence of automatic (implantable) cardiac defibrillator: Secondary | ICD-10-CM

## 2024-09-24 DIAGNOSIS — I5022 Chronic systolic (congestive) heart failure: Secondary | ICD-10-CM | POA: Diagnosis not present

## 2024-09-26 ENCOUNTER — Telehealth: Payer: Self-pay

## 2024-09-26 NOTE — Telephone Encounter (Signed)
 Remote ICM transmission received.  Attempted call to patient regarding ICM remote transmission and no answer.

## 2024-09-26 NOTE — Progress Notes (Signed)
 EPIC Encounter for ICM Monitoring  [name] is a 68 y.o. male Date: 09/26/2024 Primary Care Physican: Ofilia Lamar CROME, MD Primary Cardiologist: [facility]                                       Electrophysiologist: Inocencio 01/26/2024 Weight: 169-170 lbs      05/08/2024 Weight: 170 lbs    07/19/2024 Weight: 168-169 lbs                                              Attempted call to patient and unable to reach.    Transmission results reviewed.      Since 08/16/2024 ICM Remote Transmission:  Optivol thoracic impedance suggesting normal fluid levels with the exception of possible fluid accumulation from 09/15/2024-09/20/2024.  Optivol reset 07/23/2024.     Prescribed:  Furosemide  40 mg take 1 tablet(s) (40 mg total) by mouth as needed (take only when weight goes up 3 lbs from baseline).   Labs: 09/13/2024 Creatinine 1.41, BUN 18, Potassium 4.8, Sodium 145, GFR 55 05/17/2024 Creatinine 1.18, BUN 14, Potassium 4.4, Sodium 144, GFR 68 04/16/2024 Creatinine 1.34, BUN 16, Potassium 4.7, Sodium 143, GFR 58 A complete set of results can be found in Results Review.   Recommendations:  Unable to reach.     Follow-up plan: ICM clinic phone appointment on 10/29/2024.   91 day device clinic remote transmission 11/22/2024.     EP/Cardiology Office Visits:  Recall 03/12/2025 with Redell Leiter.   Recall 08/19/2025 with Dr Inocencio.   Copy of ICM check sent to Dr. Inocencio.    Remote monitoring is medically necessary for Heart Failure Management.    Daily Thoracic Impedance ICM trend: 06/25/2024 through 09/24/2024.    12-14 Month Thoracic Impedance ICM trend:     [name], RN 09/26/2024 4:57 PM

## 2024-10-29 ENCOUNTER — Ambulatory Visit: Attending: Cardiology

## 2024-10-29 DIAGNOSIS — I5022 Chronic systolic (congestive) heart failure: Secondary | ICD-10-CM

## 2024-10-29 DIAGNOSIS — Z95810 Presence of automatic (implantable) cardiac defibrillator: Secondary | ICD-10-CM | POA: Diagnosis not present

## 2024-10-29 NOTE — Progress Notes (Unsigned)
 EPIC Encounter for ICM Monitoring  [name] is a 68 y.o. male Date: 10/29/2024 Primary Care Physican: Ofilia Lamar CROME, MD Primary Cardiologist: [facility]                                       Electrophysiologist: Inocencio 01/26/2024 Weight: 169-170 lbs      05/08/2024 Weight: 170 lbs    07/19/2024 Weight: 168-169 lbs        10/30/2024 Weight:  168 lbs                                      Spoke with patient and heart failure questions reviewed.  Transmission results reviewed.  Pt reports swelling of feet but otherwise without complaint.     Since 09/24/2024 ICM Remote Transmission:  Optivol thoracic impedance suggesting possible fluid accumulation starting 10/19/2024 but trending back toward baseline.  Optivol reset 07/23/2024.     Prescribed:  Furosemide  40 mg take 1 tablet(s) (40 mg total) by mouth as needed (take only when weight goes up 3 lbs from baseline).   Labs: 09/13/2024 Creatinine 1.41, BUN 18, Potassium 4.8, Sodium 145, GFR 55 05/17/2024 Creatinine 1.18, BUN 14, Potassium 4.4, Sodium 144, GFR 68 04/16/2024 Creatinine 1.34, BUN 16, Potassium 4.7, Sodium 143, GFR 58 A complete set of results can be found in Results Review.   Recommendations:  He has been taking Lasix  40 mg daily x 1 week and will continue until swelling is resolved.     Follow-up plan: ICM clinic phone appointment on 11/12/2024 to recheck fluid levels.   91 day device clinic remote transmission 11/22/2024.     EP/Cardiology Office Visits:  Recall 03/12/2025 with Redell Leiter.   Recall 08/19/2025 with Dr Inocencio.   Copy of ICM check sent to Dr. Inocencio.    Remote monitoring is medically necessary for Heart Failure Management.    Daily Thoracic Impedance ICM trend: 07/30/2024 through 10/29/2024.    12-14 Month Thoracic Impedance ICM trend:     [name], RN 10/29/2024 9:19 AM

## 2024-11-09 ENCOUNTER — Other Ambulatory Visit: Payer: Self-pay

## 2024-11-12 ENCOUNTER — Telehealth: Payer: Self-pay

## 2024-11-12 ENCOUNTER — Ambulatory Visit: Attending: Cardiology

## 2024-11-12 DIAGNOSIS — Z95810 Presence of automatic (implantable) cardiac defibrillator: Secondary | ICD-10-CM

## 2024-11-12 DIAGNOSIS — I5022 Chronic systolic (congestive) heart failure: Secondary | ICD-10-CM | POA: Diagnosis not present

## 2024-11-12 NOTE — Progress Notes (Signed)
 EPIC Encounter for ICM Monitoring  [name] is a 68 y.o. male Date: 11/12/2024 Primary Care Physican: Ofilia Lamar CROME, MD Primary Cardiologist: [facility]                                       Electrophysiologist: Inocencio 01/26/2024 Weight: 169-170 lbs      05/08/2024 Weight: 170 lbs    07/19/2024 Weight: 168-169 lbs        10/30/2024 Weight:  168 lbs                                      Attempted call to patient and unable to reach.    Transmission results reviewed.      Since 10/29/2024 ICM Remote Transmission:  Optivol thoracic impedance suggesting fluid levels have returned to normal.  Optivol reset 07/23/2024.     Prescribed:  Furosemide  40 mg take 1 tablet(s) (40 mg total) by mouth as needed (take only when weight goes up 3 lbs from baseline).   Labs: 09/13/2024 Creatinine 1.41, BUN 18, Potassium 4.8, Sodium 145, GFR 55 05/17/2024 Creatinine 1.18, BUN 14, Potassium 4.4, Sodium 144, GFR 68 04/16/2024 Creatinine 1.34, BUN 16, Potassium 4.7, Sodium 143, GFR 58 A complete set of results can be found in Results Review.   Recommendations: Unable to reach.     Follow-up plan: ICM clinic phone appointment on 12/03/2024.   91 day device clinic remote transmission 11/22/2024.     EP/Cardiology Office Visits:  Recall 03/12/2025 with Redell Leiter.   Recall 08/19/2025 with Dr Inocencio.   Copy of ICM check sent to Dr. Inocencio.   Remote monitoring is medically necessary for Heart Failure Management.    Daily Thoracic Impedance ICM trend: 08/13/2024 through 11/12/2024.    12-14 Month Thoracic Impedance ICM trend:     [name], RN 11/12/2024 4:48 PM

## 2024-11-12 NOTE — Telephone Encounter (Signed)
 Remote ICM transmission received.  Attempted call to patient regarding ICM remote transmission and no answer.

## 2024-11-22 ENCOUNTER — Ambulatory Visit: Payer: Self-pay

## 2024-11-22 DIAGNOSIS — I5022 Chronic systolic (congestive) heart failure: Secondary | ICD-10-CM

## 2024-11-23 ENCOUNTER — Ambulatory Visit: Payer: Self-pay | Admitting: Cardiology

## 2024-11-23 LAB — CUP PACEART REMOTE DEVICE CHECK
Battery Remaining Longevity: 98 mo
Battery Voltage: 3 V
Brady Statistic RV Percent Paced: 0.02 %
Date Time Interrogation Session: 20260108122205
HighPow Impedance: 77 Ohm
Implantable Lead Connection Status: 753985
Implantable Lead Implant Date: 20250725
Implantable Lead Location: 753860
Implantable Pulse Generator Implant Date: 20210714
Lead Channel Impedance Value: 380 Ohm
Lead Channel Impedance Value: 494 Ohm
Lead Channel Pacing Threshold Amplitude: 0.625 V
Lead Channel Pacing Threshold Pulse Width: 0.4 ms
Lead Channel Sensing Intrinsic Amplitude: 17.5 mV
Lead Channel Sensing Intrinsic Amplitude: 17.5 mV
Lead Channel Setting Pacing Amplitude: 2 V
Lead Channel Setting Pacing Pulse Width: 0.4 ms
Lead Channel Setting Sensing Sensitivity: 0.3 mV
Zone Setting Status: 755011
Zone Setting Status: 755011

## 2024-11-27 ENCOUNTER — Telehealth: Payer: Self-pay | Admitting: Cardiology

## 2024-11-27 NOTE — Progress Notes (Signed)
 Remote ICD Transmission

## 2024-11-27 NOTE — Telephone Encounter (Signed)
" °*  STAT* If patient is at the pharmacy, call can be transferred to refill team.   1. Which medications need to be refilled? (please list name of each medication and dose if known)   dapagliflozin  propanediol (FARXIGA ) 10 MG TABS tablet     2. Would you like to learn more about the convenience, safety, & potential cost savings by using the [facility]?    3. Are you open to using the [facility] (Type [facility]..   4. Which pharmacy/location (including street and city if local pharmacy) is medication to be sent to?  [id number] - [place], [place] - [address]     5. Do they need a 30 day or 90 day supply? 90   PT is out of medication  "

## 2024-11-29 MED ORDER — DAPAGLIFLOZIN PROPANEDIOL 10 MG PO TABS: 10.0000 mg | ORAL_TABLET | Freq: Every day | ORAL | 2 refills | Status: AC

## 2024-11-29 NOTE — Telephone Encounter (Signed)
 Refill sent

## 2024-12-03 ENCOUNTER — Ambulatory Visit: Attending: Cardiology

## 2024-12-03 DIAGNOSIS — I5022 Chronic systolic (congestive) heart failure: Secondary | ICD-10-CM | POA: Diagnosis not present

## 2024-12-03 DIAGNOSIS — Z95810 Presence of automatic (implantable) cardiac defibrillator: Secondary | ICD-10-CM | POA: Diagnosis not present

## 2024-12-05 NOTE — Progress Notes (Signed)
 EPIC Encounter for ICM Monitoring  [name] is a 69 y.o. male Date: 12/05/2024 Primary Care Physican: Ofilia Lamar CROME, MD Primary Cardiologist: [facility]                                       Electrophysiologist: Inocencio 01/26/2024 Weight: 169-170 lbs      05/08/2024 Weight: 170 lbs    07/19/2024 Weight: 168-169 lbs        10/30/2024 Weight:  168 lbs                                      Transmission results reviewed.      Since 11/12/2024 ICM Remote Transmission:  Optivol thoracic impedance suggesting possible fluid accumulation starting 11/14/2024 and returned to baseline 12/01/2024.  Optivol reset 07/23/2024.     Prescribed:  Furosemide  40 mg take 1 tablet(s) (40 mg total) by mouth as needed (take only when weight goes up 3 lbs from baseline).   Labs: 09/13/2024 Creatinine 1.41, BUN 18, Potassium 4.8, Sodium 145, GFR 55 05/17/2024 Creatinine 1.18, BUN 14, Potassium 4.4, Sodium 144, GFR 68 04/16/2024 Creatinine 1.34, BUN 16, Potassium 4.7, Sodium 143, GFR 58 A complete set of results can be found in Results Review.   Recommendations: No changes.      Follow-up plan: ICM clinic phone appointment on 01/07/2025.   91 day device clinic remote transmission 02/21/2025.     EP/Cardiology Office Visits:  Recall 03/12/2025 with Redell Leiter.   Recall 08/19/2025 with Dr Inocencio.   Copy of ICM check sent to Dr. Inocencio.    Remote monitoring is medically necessary for Heart Failure Management.    Daily Thoracic Impedance ICM trend: 09/03/2024 through 12/03/2024.    12-14 Month Thoracic Impedance ICM trend:     Kenneth GORMAN Garner, RN 12/05/2024 5:04 PM

## 2024-12-13 NOTE — Progress Notes (Signed)
 31 day ICM Remote transmission canceled due to [facility] clinic is on hold until further notice.  91 day remote monitoring will continue per protocol.

## 2025-01-07 ENCOUNTER — Ambulatory Visit
